# Patient Record
Sex: Female | Born: 1960 | Race: Black or African American | Hispanic: No | Smoking: Current every day smoker
Health system: Southern US, Community
[De-identification: ages and names within clinical notes are randomized; demographics above are authoritative.]

## PROBLEM LIST (undated history)

## (undated) DIAGNOSIS — F410 Panic disorder [episodic paroxysmal anxiety] without agoraphobia: Secondary | ICD-10-CM

## (undated) HISTORY — PX: FOOT SURGERY: SHX648

## (undated) HISTORY — PX: TUBAL LIGATION: SHX77

---

## 1998-03-27 ENCOUNTER — Encounter: Admission: RE | Admit: 1998-03-27 | Discharge: 1998-03-27 | Payer: Self-pay | Admitting: Internal Medicine

## 1998-04-04 ENCOUNTER — Encounter: Admission: RE | Admit: 1998-04-04 | Discharge: 1998-07-03 | Payer: Self-pay | Admitting: *Deleted

## 1998-05-22 ENCOUNTER — Emergency Department (HOSPITAL_COMMUNITY): Admission: EM | Admit: 1998-05-22 | Discharge: 1998-05-22 | Payer: Self-pay | Admitting: Emergency Medicine

## 1998-05-31 ENCOUNTER — Emergency Department (HOSPITAL_COMMUNITY): Admission: EM | Admit: 1998-05-31 | Discharge: 1998-05-31 | Payer: Self-pay | Admitting: Emergency Medicine

## 1998-07-05 ENCOUNTER — Encounter: Admission: RE | Admit: 1998-07-05 | Discharge: 1998-07-05 | Payer: Self-pay | Admitting: Internal Medicine

## 1998-08-04 ENCOUNTER — Emergency Department (HOSPITAL_COMMUNITY): Admission: EM | Admit: 1998-08-04 | Discharge: 1998-08-04 | Payer: Self-pay

## 1998-08-24 ENCOUNTER — Emergency Department (HOSPITAL_COMMUNITY): Admission: EM | Admit: 1998-08-24 | Discharge: 1998-08-24 | Payer: Self-pay | Admitting: Emergency Medicine

## 1998-09-11 ENCOUNTER — Encounter: Admission: RE | Admit: 1998-09-11 | Discharge: 1998-09-11 | Payer: Self-pay | Admitting: Internal Medicine

## 1998-09-11 ENCOUNTER — Ambulatory Visit (HOSPITAL_COMMUNITY): Admission: RE | Admit: 1998-09-11 | Discharge: 1998-09-11 | Payer: Self-pay | Admitting: Internal Medicine

## 1998-10-28 ENCOUNTER — Emergency Department (HOSPITAL_COMMUNITY): Admission: EM | Admit: 1998-10-28 | Discharge: 1998-10-28 | Payer: Self-pay | Admitting: Emergency Medicine

## 1998-10-28 ENCOUNTER — Encounter: Payer: Self-pay | Admitting: Emergency Medicine

## 1998-11-24 ENCOUNTER — Emergency Department (HOSPITAL_COMMUNITY): Admission: EM | Admit: 1998-11-24 | Discharge: 1998-11-24 | Payer: Self-pay | Admitting: Emergency Medicine

## 1999-01-20 ENCOUNTER — Emergency Department (HOSPITAL_COMMUNITY): Admission: EM | Admit: 1999-01-20 | Discharge: 1999-01-20 | Payer: Self-pay | Admitting: Emergency Medicine

## 1999-02-05 ENCOUNTER — Encounter: Admission: RE | Admit: 1999-02-05 | Discharge: 1999-02-05 | Payer: Self-pay | Admitting: Internal Medicine

## 1999-03-17 ENCOUNTER — Emergency Department (HOSPITAL_COMMUNITY): Admission: EM | Admit: 1999-03-17 | Discharge: 1999-03-17 | Payer: Self-pay | Admitting: Emergency Medicine

## 1999-10-05 ENCOUNTER — Emergency Department (HOSPITAL_COMMUNITY): Admission: EM | Admit: 1999-10-05 | Discharge: 1999-10-05 | Payer: Self-pay | Admitting: Internal Medicine

## 1999-10-31 ENCOUNTER — Emergency Department (HOSPITAL_COMMUNITY): Admission: EM | Admit: 1999-10-31 | Discharge: 1999-10-31 | Payer: Self-pay | Admitting: Emergency Medicine

## 2000-04-09 ENCOUNTER — Emergency Department (HOSPITAL_COMMUNITY): Admission: EM | Admit: 2000-04-09 | Discharge: 2000-04-09 | Payer: Self-pay

## 2000-09-05 ENCOUNTER — Emergency Department (HOSPITAL_COMMUNITY): Admission: EM | Admit: 2000-09-05 | Discharge: 2000-09-05 | Payer: Self-pay | Admitting: Emergency Medicine

## 2000-12-04 ENCOUNTER — Emergency Department (HOSPITAL_COMMUNITY): Admission: EM | Admit: 2000-12-04 | Discharge: 2000-12-04 | Payer: Self-pay | Admitting: Emergency Medicine

## 2001-03-27 ENCOUNTER — Emergency Department (HOSPITAL_COMMUNITY): Admission: EM | Admit: 2001-03-27 | Discharge: 2001-03-27 | Payer: Self-pay | Admitting: Emergency Medicine

## 2001-06-08 ENCOUNTER — Emergency Department (HOSPITAL_COMMUNITY): Admission: EM | Admit: 2001-06-08 | Discharge: 2001-06-08 | Payer: Self-pay | Admitting: Emergency Medicine

## 2001-07-22 ENCOUNTER — Emergency Department (HOSPITAL_COMMUNITY): Admission: EM | Admit: 2001-07-22 | Discharge: 2001-07-22 | Payer: Self-pay | Admitting: Emergency Medicine

## 2001-07-28 ENCOUNTER — Encounter: Admission: RE | Admit: 2001-07-28 | Discharge: 2001-07-28 | Payer: Self-pay | Admitting: Internal Medicine

## 2001-08-04 ENCOUNTER — Encounter: Admission: RE | Admit: 2001-08-04 | Discharge: 2001-08-04 | Payer: Self-pay | Admitting: Internal Medicine

## 2001-11-15 ENCOUNTER — Emergency Department (HOSPITAL_COMMUNITY): Admission: EM | Admit: 2001-11-15 | Discharge: 2001-11-15 | Payer: Self-pay | Admitting: Emergency Medicine

## 2001-11-18 ENCOUNTER — Inpatient Hospital Stay (HOSPITAL_COMMUNITY): Admission: AD | Admit: 2001-11-18 | Discharge: 2001-11-18 | Payer: Self-pay | Admitting: *Deleted

## 2001-11-18 ENCOUNTER — Encounter: Payer: Self-pay | Admitting: Obstetrics & Gynecology

## 2001-11-18 ENCOUNTER — Encounter: Admission: RE | Admit: 2001-11-18 | Discharge: 2001-11-18 | Payer: Self-pay | Admitting: Internal Medicine

## 2001-11-21 ENCOUNTER — Inpatient Hospital Stay (HOSPITAL_COMMUNITY): Admission: AD | Admit: 2001-11-21 | Discharge: 2001-11-21 | Payer: Self-pay | Admitting: Obstetrics

## 2001-11-24 ENCOUNTER — Inpatient Hospital Stay (HOSPITAL_COMMUNITY): Admission: AD | Admit: 2001-11-24 | Discharge: 2001-11-24 | Payer: Self-pay | Admitting: Obstetrics & Gynecology

## 2001-12-02 ENCOUNTER — Encounter: Admission: RE | Admit: 2001-12-02 | Discharge: 2001-12-02 | Payer: Self-pay | Admitting: Obstetrics & Gynecology

## 2002-01-05 ENCOUNTER — Emergency Department (HOSPITAL_COMMUNITY): Admission: EM | Admit: 2002-01-05 | Discharge: 2002-01-05 | Payer: Self-pay | Admitting: Emergency Medicine

## 2002-01-09 ENCOUNTER — Encounter: Admission: RE | Admit: 2002-01-09 | Discharge: 2002-01-09 | Payer: Self-pay | Admitting: Internal Medicine

## 2002-03-27 ENCOUNTER — Encounter: Admission: RE | Admit: 2002-03-27 | Discharge: 2002-03-27 | Payer: Self-pay

## 2002-04-24 ENCOUNTER — Encounter: Admission: RE | Admit: 2002-04-24 | Discharge: 2002-04-24 | Payer: Self-pay | Admitting: Internal Medicine

## 2002-05-25 ENCOUNTER — Encounter: Admission: RE | Admit: 2002-05-25 | Discharge: 2002-05-25 | Payer: Self-pay | Admitting: Internal Medicine

## 2002-07-10 ENCOUNTER — Encounter: Admission: RE | Admit: 2002-07-10 | Discharge: 2002-07-10 | Payer: Self-pay | Admitting: Internal Medicine

## 2002-07-24 ENCOUNTER — Emergency Department (HOSPITAL_COMMUNITY): Admission: EM | Admit: 2002-07-24 | Discharge: 2002-07-25 | Payer: Self-pay | Admitting: Emergency Medicine

## 2002-08-10 ENCOUNTER — Encounter: Admission: RE | Admit: 2002-08-10 | Discharge: 2002-08-10 | Payer: Self-pay | Admitting: Internal Medicine

## 2002-08-18 ENCOUNTER — Ambulatory Visit (HOSPITAL_BASED_OUTPATIENT_CLINIC_OR_DEPARTMENT_OTHER): Admission: RE | Admit: 2002-08-18 | Discharge: 2002-08-18 | Payer: Self-pay | Admitting: Podiatry

## 2002-08-24 ENCOUNTER — Encounter: Admission: RE | Admit: 2002-08-24 | Discharge: 2002-08-24 | Payer: Self-pay | Admitting: *Deleted

## 2003-02-23 ENCOUNTER — Emergency Department (HOSPITAL_COMMUNITY): Admission: EM | Admit: 2003-02-23 | Discharge: 2003-02-23 | Payer: Self-pay | Admitting: *Deleted

## 2003-03-09 ENCOUNTER — Encounter: Payer: Self-pay | Admitting: Emergency Medicine

## 2003-03-09 ENCOUNTER — Emergency Department (HOSPITAL_COMMUNITY): Admission: EM | Admit: 2003-03-09 | Discharge: 2003-03-09 | Payer: Self-pay | Admitting: Emergency Medicine

## 2003-03-10 ENCOUNTER — Emergency Department (HOSPITAL_COMMUNITY): Admission: EM | Admit: 2003-03-10 | Discharge: 2003-03-10 | Payer: Self-pay | Admitting: Emergency Medicine

## 2003-06-23 ENCOUNTER — Emergency Department (HOSPITAL_COMMUNITY): Admission: EM | Admit: 2003-06-23 | Discharge: 2003-06-23 | Payer: Self-pay | Admitting: Emergency Medicine

## 2003-07-13 ENCOUNTER — Encounter: Admission: RE | Admit: 2003-07-13 | Discharge: 2003-07-13 | Payer: Self-pay | Admitting: Internal Medicine

## 2003-08-29 ENCOUNTER — Encounter: Admission: RE | Admit: 2003-08-29 | Discharge: 2003-08-29 | Payer: Self-pay | Admitting: Internal Medicine

## 2003-10-02 ENCOUNTER — Emergency Department (HOSPITAL_COMMUNITY): Admission: EM | Admit: 2003-10-02 | Discharge: 2003-10-02 | Payer: Self-pay

## 2004-04-11 ENCOUNTER — Inpatient Hospital Stay (HOSPITAL_COMMUNITY): Admission: AD | Admit: 2004-04-11 | Discharge: 2004-04-11 | Payer: Self-pay | Admitting: *Deleted

## 2004-09-28 ENCOUNTER — Emergency Department (HOSPITAL_COMMUNITY): Admission: EM | Admit: 2004-09-28 | Discharge: 2004-09-28 | Payer: Self-pay | Admitting: Emergency Medicine

## 2004-12-29 ENCOUNTER — Emergency Department (HOSPITAL_COMMUNITY): Admission: EM | Admit: 2004-12-29 | Discharge: 2004-12-29 | Payer: Self-pay | Admitting: Emergency Medicine

## 2005-03-10 ENCOUNTER — Emergency Department (HOSPITAL_COMMUNITY): Admission: EM | Admit: 2005-03-10 | Discharge: 2005-03-10 | Payer: Self-pay | Admitting: Emergency Medicine

## 2005-04-24 ENCOUNTER — Emergency Department (HOSPITAL_COMMUNITY): Admission: EM | Admit: 2005-04-24 | Discharge: 2005-04-25 | Payer: Self-pay | Admitting: Emergency Medicine

## 2005-08-05 ENCOUNTER — Emergency Department (HOSPITAL_COMMUNITY): Admission: EM | Admit: 2005-08-05 | Discharge: 2005-08-05 | Payer: Self-pay | Admitting: Emergency Medicine

## 2006-03-01 ENCOUNTER — Ambulatory Visit: Payer: Self-pay | Admitting: Hospitalist

## 2006-03-01 ENCOUNTER — Ambulatory Visit (HOSPITAL_COMMUNITY): Admission: RE | Admit: 2006-03-01 | Discharge: 2006-03-01 | Payer: Self-pay | Admitting: Hospitalist

## 2006-03-08 ENCOUNTER — Ambulatory Visit: Payer: Self-pay | Admitting: Internal Medicine

## 2006-03-22 ENCOUNTER — Ambulatory Visit: Payer: Self-pay | Admitting: Internal Medicine

## 2006-07-29 ENCOUNTER — Emergency Department (HOSPITAL_COMMUNITY): Admission: EM | Admit: 2006-07-29 | Discharge: 2006-07-30 | Payer: Self-pay | Admitting: Emergency Medicine

## 2006-09-03 ENCOUNTER — Emergency Department (HOSPITAL_COMMUNITY): Admission: EM | Admit: 2006-09-03 | Discharge: 2006-09-04 | Payer: Self-pay | Admitting: Emergency Medicine

## 2006-10-14 DIAGNOSIS — R300 Dysuria: Secondary | ICD-10-CM | POA: Insufficient documentation

## 2006-10-14 DIAGNOSIS — E119 Type 2 diabetes mellitus without complications: Secondary | ICD-10-CM | POA: Insufficient documentation

## 2006-10-14 DIAGNOSIS — R809 Proteinuria, unspecified: Secondary | ICD-10-CM | POA: Insufficient documentation

## 2006-10-14 DIAGNOSIS — R42 Dizziness and giddiness: Secondary | ICD-10-CM | POA: Insufficient documentation

## 2006-11-12 ENCOUNTER — Emergency Department (HOSPITAL_COMMUNITY): Admission: EM | Admit: 2006-11-12 | Discharge: 2006-11-12 | Payer: Self-pay | Admitting: Emergency Medicine

## 2006-12-09 DIAGNOSIS — D649 Anemia, unspecified: Secondary | ICD-10-CM

## 2006-12-09 DIAGNOSIS — J40 Bronchitis, not specified as acute or chronic: Secondary | ICD-10-CM | POA: Insufficient documentation

## 2006-12-09 DIAGNOSIS — J301 Allergic rhinitis due to pollen: Secondary | ICD-10-CM | POA: Insufficient documentation

## 2006-12-09 DIAGNOSIS — O009 Unspecified ectopic pregnancy without intrauterine pregnancy: Secondary | ICD-10-CM | POA: Insufficient documentation

## 2006-12-09 DIAGNOSIS — Q742 Other congenital malformations of lower limb(s), including pelvic girdle: Secondary | ICD-10-CM

## 2006-12-09 DIAGNOSIS — O039 Complete or unspecified spontaneous abortion without complication: Secondary | ICD-10-CM | POA: Insufficient documentation

## 2006-12-15 ENCOUNTER — Emergency Department (HOSPITAL_COMMUNITY): Admission: EM | Admit: 2006-12-15 | Discharge: 2006-12-16 | Payer: Self-pay | Admitting: Emergency Medicine

## 2007-01-29 ENCOUNTER — Emergency Department (HOSPITAL_COMMUNITY): Admission: EM | Admit: 2007-01-29 | Discharge: 2007-01-29 | Payer: Self-pay | Admitting: Emergency Medicine

## 2007-04-15 ENCOUNTER — Inpatient Hospital Stay (HOSPITAL_COMMUNITY): Admission: AD | Admit: 2007-04-15 | Discharge: 2007-04-15 | Payer: Self-pay | Admitting: Family Medicine

## 2007-05-13 ENCOUNTER — Emergency Department (HOSPITAL_COMMUNITY): Admission: EM | Admit: 2007-05-13 | Discharge: 2007-05-13 | Payer: Self-pay | Admitting: Emergency Medicine

## 2007-05-18 ENCOUNTER — Encounter (INDEPENDENT_AMBULATORY_CARE_PROVIDER_SITE_OTHER): Payer: Self-pay | Admitting: *Deleted

## 2007-05-18 ENCOUNTER — Ambulatory Visit: Payer: Self-pay | Admitting: Internal Medicine

## 2007-05-18 DIAGNOSIS — R1110 Vomiting, unspecified: Secondary | ICD-10-CM

## 2007-05-18 LAB — CONVERTED CEMR LAB
BUN: 8 mg/dL (ref 6–23)
Blood Glucose, Fingerstick: 210
Hgb A1c MFr Bld: 10.5 %
Potassium: 4.1 meq/L (ref 3.5–5.3)
Sodium: 140 meq/L (ref 135–145)

## 2007-05-19 ENCOUNTER — Telehealth (INDEPENDENT_AMBULATORY_CARE_PROVIDER_SITE_OTHER): Payer: Self-pay | Admitting: *Deleted

## 2007-08-07 ENCOUNTER — Emergency Department (HOSPITAL_COMMUNITY): Admission: EM | Admit: 2007-08-07 | Discharge: 2007-08-07 | Payer: Self-pay | Admitting: Emergency Medicine

## 2007-09-03 ENCOUNTER — Emergency Department (HOSPITAL_COMMUNITY): Admission: EM | Admit: 2007-09-03 | Discharge: 2007-09-03 | Payer: Self-pay | Admitting: Emergency Medicine

## 2007-09-14 ENCOUNTER — Encounter (INDEPENDENT_AMBULATORY_CARE_PROVIDER_SITE_OTHER): Payer: Self-pay | Admitting: *Deleted

## 2007-09-14 ENCOUNTER — Ambulatory Visit: Payer: Self-pay | Admitting: Internal Medicine

## 2007-09-14 DIAGNOSIS — M26629 Arthralgia of temporomandibular joint, unspecified side: Secondary | ICD-10-CM

## 2007-09-14 DIAGNOSIS — F17200 Nicotine dependence, unspecified, uncomplicated: Secondary | ICD-10-CM

## 2007-09-14 LAB — CONVERTED CEMR LAB
Blood Glucose, Fingerstick: 195
Hgb A1c MFr Bld: 8.9 %

## 2007-11-19 ENCOUNTER — Emergency Department (HOSPITAL_COMMUNITY): Admission: EM | Admit: 2007-11-19 | Discharge: 2007-11-19 | Payer: Self-pay | Admitting: Emergency Medicine

## 2008-03-07 ENCOUNTER — Emergency Department (HOSPITAL_COMMUNITY): Admission: EM | Admit: 2008-03-07 | Discharge: 2008-03-08 | Payer: Self-pay | Admitting: Emergency Medicine

## 2008-07-06 ENCOUNTER — Emergency Department (HOSPITAL_COMMUNITY): Admission: EM | Admit: 2008-07-06 | Discharge: 2008-07-06 | Payer: Self-pay | Admitting: Emergency Medicine

## 2008-11-06 ENCOUNTER — Emergency Department (HOSPITAL_COMMUNITY): Admission: EM | Admit: 2008-11-06 | Discharge: 2008-11-06 | Payer: Self-pay | Admitting: Emergency Medicine

## 2009-01-06 ENCOUNTER — Emergency Department (HOSPITAL_COMMUNITY): Admission: EM | Admit: 2009-01-06 | Discharge: 2009-01-07 | Payer: Self-pay | Admitting: Emergency Medicine

## 2009-02-12 ENCOUNTER — Emergency Department (HOSPITAL_COMMUNITY): Admission: EM | Admit: 2009-02-12 | Discharge: 2009-02-12 | Payer: Self-pay | Admitting: Emergency Medicine

## 2009-02-13 ENCOUNTER — Emergency Department (HOSPITAL_COMMUNITY): Admission: EM | Admit: 2009-02-13 | Discharge: 2009-02-13 | Payer: Self-pay | Admitting: Pediatrics

## 2009-06-14 ENCOUNTER — Emergency Department (HOSPITAL_COMMUNITY): Admission: EM | Admit: 2009-06-14 | Discharge: 2009-06-14 | Payer: Self-pay | Admitting: Emergency Medicine

## 2009-08-15 ENCOUNTER — Inpatient Hospital Stay (HOSPITAL_COMMUNITY): Admission: AD | Admit: 2009-08-15 | Discharge: 2009-08-16 | Payer: Self-pay | Admitting: Obstetrics and Gynecology

## 2009-08-21 ENCOUNTER — Emergency Department (HOSPITAL_COMMUNITY): Admission: EM | Admit: 2009-08-21 | Discharge: 2009-08-21 | Payer: Self-pay | Admitting: Emergency Medicine

## 2009-09-29 ENCOUNTER — Emergency Department (HOSPITAL_COMMUNITY): Admission: EM | Admit: 2009-09-29 | Discharge: 2009-09-29 | Payer: Self-pay | Admitting: Emergency Medicine

## 2010-09-25 ENCOUNTER — Emergency Department (HOSPITAL_COMMUNITY)
Admission: EM | Admit: 2010-09-25 | Discharge: 2010-09-25 | Payer: Self-pay | Source: Home / Self Care | Admitting: Emergency Medicine

## 2010-11-17 ENCOUNTER — Inpatient Hospital Stay (HOSPITAL_COMMUNITY)
Admission: AD | Admit: 2010-11-17 | Discharge: 2010-11-17 | Payer: Self-pay | Source: Home / Self Care | Attending: Obstetrics & Gynecology | Admitting: Obstetrics & Gynecology

## 2010-12-18 ENCOUNTER — Ambulatory Visit: Admit: 2010-12-18 | Payer: Self-pay | Admitting: Obstetrics and Gynecology

## 2011-02-09 LAB — URINE MICROSCOPIC-ADD ON

## 2011-02-09 LAB — URINALYSIS, ROUTINE W REFLEX MICROSCOPIC
Bilirubin Urine: NEGATIVE
Leukocytes, UA: NEGATIVE
Nitrite: NEGATIVE
Specific Gravity, Urine: 1.02 (ref 1.005–1.030)
Urobilinogen, UA: 0.2 mg/dL (ref 0.0–1.0)

## 2011-02-09 LAB — WET PREP, GENITAL: Trich, Wet Prep: NONE SEEN

## 2011-02-09 LAB — GC/CHLAMYDIA PROBE AMP, GENITAL
Chlamydia, DNA Probe: NEGATIVE
GC Probe Amp, Genital: NEGATIVE

## 2011-02-11 LAB — GLUCOSE, CAPILLARY

## 2011-03-05 LAB — URINALYSIS, ROUTINE W REFLEX MICROSCOPIC
Glucose, UA: 100 mg/dL — AB
Protein, ur: 100 mg/dL — AB
Specific Gravity, Urine: 1.016 (ref 1.005–1.030)

## 2011-03-05 LAB — BASIC METABOLIC PANEL
BUN: 13 mg/dL (ref 6–23)
Calcium: 9 mg/dL (ref 8.4–10.5)
GFR calc non Af Amer: >60 mL/min (ref >60)
Glucose, Bld: 235 mg/dL — ABNORMAL HIGH (ref 70–99)

## 2011-03-05 LAB — CBC
Platelets: 291 10*3/uL (ref 150–400)
RDW: 15.1 % (ref 11.5–15.5)

## 2011-03-05 LAB — URINE MICROSCOPIC-ADD ON

## 2011-03-05 LAB — DIFFERENTIAL
Basophils Absolute: 0.1 10*3/uL (ref 0.0–0.1)
Eosinophils Relative: 4 % (ref 0–5)
Lymphocytes Relative: 28 % (ref 12–46)
Neutro Abs: 6.6 10*3/uL (ref 1.7–7.7)

## 2011-03-05 LAB — POCT PREGNANCY, URINE: Preg Test, Ur: NEGATIVE

## 2011-03-06 LAB — GLUCOSE, CAPILLARY

## 2011-03-06 LAB — POCT PREGNANCY, URINE: Preg Test, Ur: NEGATIVE

## 2011-03-06 LAB — WET PREP, GENITAL: Trich, Wet Prep: NONE SEEN

## 2011-03-08 LAB — GLUCOSE, CAPILLARY: Glucose-Capillary: 127 mg/dL — ABNORMAL HIGH (ref 70–99)

## 2011-03-12 LAB — BASIC METABOLIC PANEL
BUN: 12 mg/dL (ref 6–23)
CO2: 26 mEq/L (ref 19–32)
Calcium: 9.3 mg/dL (ref 8.4–10.5)
Creatinine, Ser: 0.81 mg/dL (ref 0.4–1.2)
GFR calc Af Amer: >60 mL/min (ref >60)

## 2011-03-12 LAB — DIFFERENTIAL
Basophils Absolute: 0 10*3/uL (ref 0.0–0.1)
Lymphocytes Relative: 31 % (ref 12–46)
Lymphs Abs: 3.2 10*3/uL (ref 0.7–4.0)
Monocytes Absolute: 0.5 10*3/uL (ref 0.1–1.0)
Neutro Abs: 6.3 10*3/uL (ref 1.7–7.7)

## 2011-03-12 LAB — CK TOTAL AND CKMB (NOT AT ARMC): Total CK: 79 U/L (ref 7–177)

## 2011-03-12 LAB — URINALYSIS, ROUTINE W REFLEX MICROSCOPIC
Leukocytes, UA: NEGATIVE
Protein, ur: 100 mg/dL — AB
Specific Gravity, Urine: 1.021 (ref 1.005–1.030)
Urobilinogen, UA: 0.2 mg/dL (ref 0.0–1.0)

## 2011-03-12 LAB — CBC
HCT: 41.8 % (ref 36.0–46.0)
Hemoglobin: 13.3 g/dL (ref 12.0–15.0)
MCHC: 31.7 g/dL (ref 30.0–36.0)
RBC: 5.88 MIL/uL — ABNORMAL HIGH (ref 3.87–5.11)
RDW: 15.2 % (ref 11.5–15.5)

## 2011-03-12 LAB — URINE MICROSCOPIC-ADD ON

## 2011-03-12 LAB — GLUCOSE, CAPILLARY

## 2011-03-12 LAB — TROPONIN I: Troponin I: 0.04 ng/mL (ref 0.00–0.06)

## 2011-03-17 LAB — POCT I-STAT, CHEM 8
BUN: 9 mg/dL (ref 6–23)
Calcium, Ion: 1.15 mmol/L (ref 1.12–1.32)
Creatinine, Ser: 0.8 mg/dL (ref 0.4–1.2)
Glucose, Bld: 163 mg/dL — ABNORMAL HIGH (ref 70–99)
Sodium: 138 mEq/L (ref 135–145)
TCO2: 27 mmol/L (ref 0–100)

## 2011-03-17 LAB — URINALYSIS, ROUTINE W REFLEX MICROSCOPIC
Leukocytes, UA: NEGATIVE
Nitrite: POSITIVE — AB
Specific Gravity, Urine: 1.01 (ref 1.005–1.030)
Urobilinogen, UA: 1 mg/dL (ref 0.0–1.0)

## 2011-03-17 LAB — URINE MICROSCOPIC-ADD ON

## 2011-04-17 NOTE — Op Note (Signed)
NAME:  Carol Hardy, Carol Hardy                          ACCOUNT NO.:  [id number]   MEDICAL RECORD NO.:  [id number]                   PATIENT TYPE:  AMB   LOCATION:  DSC                                  FACILITY:  [facility]   PHYSICIAN:  Ysidro Evert. Regal, D.P.M.              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   POSTOPERATIVE DIAGNOSIS:  Brachymetatarsia of the left third and fourth  metatarsal of the left foot.   OPERATION:  1. Osteotomy of the third metatarsal left.  2. Osteotomy of the fourth metatarsal left.  3. Application of an external fixator to allow for callus distraction and     elongation of the third and fourth metatarsal bones.   SURGEON:  Ysidro Evert. Regal, D.P.M.   ASSISTANT:  Angeles Secord   ANESTHESIA:  IV sedation with local infiltration.   HEMOSTASIS:  Ankle tourniquet left.   INDICATIONS FOR PROCEDURE:  Discomfort with more and more difficulty with  weight bearing secondary to positional components of short, congential third  and fourth metatarsal bones confirmed on x-ray.   FINDINGS AND PROCEDURE:  This patient was brought to the operating room,  placed in the supine position on the operating room table.  The patient was  injected with a total of 15 cc of Xylocaine and Marcaine mixture.  The  patient's left foot was prepped and draped utilizing standard aseptic  technique.  The left was exsanguinated utilizing Esmarch and the left ankle  tourniquet was inflated to 250 mmHg.  The following procedures were  performed.   PROCEDURE #1:  Osteotomy of the fourth metatarsal left.  Attention was  directed to the dorsal aspect of the fourth metatarsal left, where a 4 cm  incision was made over the shaft of the fourth metatarsal.  The incision was  deepened down to  subcutaneous tissue.  We utilized internal Zy Scan. We  visualized the base of the fourth metatarsal.  We then excised capsular  tissue to allow for good exposure of the metatarsal shaft itself.  The  osteotomy was cut after application of the external fixator but as a part of  a conjoined type procedure.  Closure will follow.   PROCEDURE #2:  Osteotomy third metatarsal left.  Attention was directed to  the dorsal aspect of the third metatarsal left, where an approximate 3 cm  linear incision was made over the shaft of the third metatarsal.  The  incision was deepened down to subcutaneous tissue and down to capsule.  Where the capsular tissue was sharply dissected off the underlying bone.  Utilized internal Zy Scan we visualized the base of the third metatarsal and  found the place that osteotomy cut would be made.  The cut was planned and  the foot was prepped in order for this cut to be made.  We then applied the  external fixator, the first more most proximal pin was placed in the cuboid  and visualized with internal Zy Scan.  We then placed the external fixator  device with three different rings to it.  We placed the pin in the fourth  metatarsal neck and visualized with Zy Scan.  A pin was then placed in the  third metatarsal neck, visualized with Zy Scan.  A second pin was placed in  the distal fourth metatarsal, a second pin was placed in the third  metatarsal.  We then placed a proximal pin into the cuboid and we then did  osteotomy cuts in both the third and fourth metatarsal bones.  This was  performed just proximal to the base of the third and fourth metatarsals.  At  this point, we activated the fixation device.  We were able to distract the  third and fourth metatarsals adequately.  We then recompressed and bolted  down the device.  At this point, we added an L ring to the medial side in  order to provide for stabilization and prevent Tor on the third metatarsal.  This was  found to be in adequate position and all fixation was found to be  adequate as visualized on internal Zy Scan.  We did copious flush to the  area.  The subcutaneous tissues was reapproximated utilizing 4-0 Dexon in  continuous running fashion.  Skin margins were reapproximated utilizing 5-0  Dexon in a running baseball stitch.  Wound edges, we utilized 1 cc of  dexamethasone.  Dry sterile compressive dressing was applied to the left  foot.  The left ankle tourniquet was deflated.  Capillary fill was noted to  be immediate to all joints of the left foot.  The patient having tolerated  the surgery and anesthesia well was taken from the operating room in  satisfactory condition.                                               Ysidro Evert. Regal, D.P.M.    NSR/MEDQ  D:  08/18/2002  T:  08/20/2002  Job:  [id number]

## 2011-05-11 ENCOUNTER — Emergency Department (HOSPITAL_COMMUNITY)
Admission: EM | Admit: 2011-05-11 | Discharge: 2011-05-11 | Disposition: A | Discharge status: Home / Self Care | Payer: Self-pay | Attending: Emergency Medicine | Admitting: Emergency Medicine

## 2011-05-11 ENCOUNTER — Emergency Department (HOSPITAL_COMMUNITY): Payer: Self-pay

## 2011-05-11 DIAGNOSIS — T148XXA Other injury of unspecified body region, initial encounter: Secondary | ICD-10-CM | POA: Insufficient documentation

## 2011-05-11 DIAGNOSIS — I10 Essential (primary) hypertension: Secondary | ICD-10-CM | POA: Insufficient documentation

## 2011-05-11 DIAGNOSIS — F17200 Nicotine dependence, unspecified, uncomplicated: Secondary | ICD-10-CM | POA: Insufficient documentation

## 2011-05-11 DIAGNOSIS — R079 Chest pain, unspecified: Secondary | ICD-10-CM | POA: Insufficient documentation

## 2011-05-11 LAB — URINALYSIS, ROUTINE W REFLEX MICROSCOPIC
Bilirubin Urine: NEGATIVE
Nitrite: NEGATIVE
Specific Gravity, Urine: 1.021 (ref 1.005–1.030)
pH: 5 (ref 5.0–8.0)

## 2011-05-11 LAB — URINE MICROSCOPIC-ADD ON

## 2011-05-11 LAB — GLUCOSE, CAPILLARY: Glucose-Capillary: 145 mg/dL — ABNORMAL HIGH (ref 70–99)

## 2011-08-25 LAB — GC/CHLAMYDIA PROBE AMP, GENITAL
Chlamydia, DNA Probe: NEGATIVE
GC Probe Amp, Genital: NEGATIVE

## 2011-08-25 LAB — PREGNANCY, URINE: Preg Test, Ur: NEGATIVE

## 2011-08-25 LAB — URINALYSIS, ROUTINE W REFLEX MICROSCOPIC
Bilirubin Urine: NEGATIVE
Ketones, ur: NEGATIVE
Nitrite: NEGATIVE
Specific Gravity, Urine: 1.012
Urobilinogen, UA: 0.2

## 2011-08-25 LAB — WET PREP, GENITAL

## 2011-09-03 LAB — POCT I-STAT, CHEM 8
BUN: 17 mg/dL (ref 6–23)
Creatinine, Ser: 0.9 mg/dL (ref 0.4–1.2)
Glucose, Bld: 209 mg/dL — ABNORMAL HIGH (ref 70–99)
Sodium: 140 mEq/L (ref 135–145)
TCO2: 26 mmol/L (ref 0–100)

## 2011-09-03 LAB — GLUCOSE, CAPILLARY: Glucose-Capillary: 237 mg/dL — ABNORMAL HIGH (ref 70–99)

## 2011-09-17 LAB — DIFFERENTIAL
Basophils Relative: 1
Eosinophils Absolute: 0.5
Lymphs Abs: 3.8 — ABNORMAL HIGH
Monocytes Absolute: 0.5
Neutro Abs: 5.4

## 2011-09-17 LAB — URINALYSIS, ROUTINE W REFLEX MICROSCOPIC
Glucose, UA: >1000 — AB
Hgb urine dipstick: NEGATIVE
Ketones, ur: NEGATIVE
Leukocytes, UA: NEGATIVE
pH: 6

## 2011-09-17 LAB — CBC
Hemoglobin: 13.1
MCHC: 31.4
MCV: 69.8 — ABNORMAL LOW
RBC: 5.96 — ABNORMAL HIGH

## 2011-09-17 LAB — URINE MICROSCOPIC-ADD ON

## 2011-09-17 LAB — WET PREP, GENITAL
Trich, Wet Prep: NONE SEEN
Yeast Wet Prep HPF POC: NONE SEEN

## 2011-10-27 ENCOUNTER — Emergency Department (HOSPITAL_COMMUNITY)
Admission: EM | Admit: 2011-10-27 | Discharge: 2011-10-27 | Disposition: A | Discharge status: Home / Self Care | Payer: Self-pay | Attending: Emergency Medicine | Admitting: Emergency Medicine

## 2011-10-27 ENCOUNTER — Emergency Department (HOSPITAL_COMMUNITY): Payer: Self-pay

## 2011-10-27 ENCOUNTER — Encounter: Payer: Self-pay | Admitting: Emergency Medicine

## 2011-10-27 DIAGNOSIS — M25511 Pain in right shoulder: Secondary | ICD-10-CM

## 2011-10-27 DIAGNOSIS — F17200 Nicotine dependence, unspecified, uncomplicated: Secondary | ICD-10-CM | POA: Insufficient documentation

## 2011-10-27 DIAGNOSIS — G8929 Other chronic pain: Secondary | ICD-10-CM | POA: Insufficient documentation

## 2011-10-27 DIAGNOSIS — M25519 Pain in unspecified shoulder: Secondary | ICD-10-CM | POA: Insufficient documentation

## 2011-10-27 MED ORDER — OXYCODONE-ACETAMINOPHEN 5-325 MG PO TABS
1.0000 | ORAL_TABLET | Freq: Once | ORAL | Status: AC
  Administered 2011-10-27: 1 via ORAL
  Filled 2011-10-27: qty 1

## 2011-10-27 MED ORDER — ONDANSETRON HCL 4 MG PO TABS: 4.0000 mg | ORAL_TABLET | Freq: Once | ORAL | Status: DC

## 2011-10-27 MED ORDER — OXYCODONE-ACETAMINOPHEN 5-325 MG PO TABS: 1.0000 | ORAL_TABLET | Freq: Four times a day (QID) | ORAL | Status: AC | PRN

## 2011-10-27 NOTE — ED Notes (Signed)
Secondary Assessment: pt c/o right arm pain, 10/10, sharp, continuous, no radiation, no nausea, vomiting, no chest pain, no sob, onset since this summer but is worsen yesterday. Limited ROM noted. Pain worsen with movement. Pt alert, oriented.

## 2011-10-27 NOTE — ED Notes (Signed)
Pt states she thinks she had a heat stroke this summer, never was able to be seen. Pt states from that time has been unable to lift R arm above shoulder height. Pt states yesterday she got a pain in R shoulder followed by numbness. . No neuro deficits noted at this time. Pt grips equal bilateral.

## 2011-10-27 NOTE — ED Provider Notes (Signed)
History     CSN: [id number] Arrival date & time: 10/27/2011 12:41 PM   First MD Initiated Contact with Patient 10/27/11 1309      Chief Complaint  Patient presents with  . Shoulder Pain    (Consider location/radiation/quality/duration/timing/severity/associated sxs/prior treatment) HPI Pt reports R shoulder pain constantly for the last several months since 'heat stroke' during the summer. She was not seen at that time. Denies any known injury. States pain is worse with movement and unable to raise her arm above her shoulder. She had some tingling in her hand and increased pain earlier today and decided to come to the ED for evaluation. She was previously taking NSAIDs but has not taken anything in several weeks.   Past Medical History  Diagnosis Date  . Diabetes mellitus     History reviewed. No pertinent past surgical history.  History reviewed. No pertinent family history.  History  Substance Use Topics  . Smoking status: Current Everyday Smoker -- 0.5 packs/day    Types: Cigarettes  . Smokeless tobacco: Not on file  . Alcohol Use: No    OB History    Grav Para Term Preterm Abortions TAB SAB Ect Mult Living                  Review of Systems Unable to assess due to mental status.   Allergies  Ibuprofen and Vicodin  Home Medications  No current outpatient prescriptions on file.  BP 152/75  Pulse 92  Temp(Src) 98.9 F (37.2 C) (Oral)  Resp 16  SpO2 100%  LMP 10/05/2011  Physical Exam  Nursing note and vitals reviewed. Constitutional: She is oriented to person, place, and time. She appears well-developed and well-nourished.  HENT:  Head: Normocephalic and atraumatic.  Eyes: EOM are normal. Pupils are equal, round, and reactive to light.  Neck: Normal range of motion. Neck supple.  Cardiovascular: Normal rate, normal heart sounds and intact distal pulses.   Pulmonary/Chest: Effort normal and breath sounds normal.  Abdominal: Bowel sounds are normal.  She exhibits no distension. There is no tenderness.  Musculoskeletal: She exhibits tenderness. She exhibits no edema.       Pain with ROM of R shoulder, tender to palpation diffusely, no deformity, neurovascularly intact  Neurological: She is alert and oriented to person, place, and time. She has normal strength. No cranial nerve deficit or sensory deficit. She exhibits normal muscle tone.  Skin: Skin is warm and dry. No rash noted.  Psychiatric: She has a normal mood and affect.    ED Course  Procedures (including critical care time)  Labs Reviewed - No data to display No results found.   No diagnosis found.    MDM  Discussed xray findings with the patient and with Dr. Azucena Kuba. No known prior surgery. Has had prior shoulder injury which may have resulted in demonstrated findings. Pt advised to followup with Ortho for long term management. She has normal neuro exam now, no concern for CVA. Suspect some nerve impingement as the source of the tingling she experienced earlier today.        Charles B. Bernette Mayers, MD 10/27/11 1428

## 2011-10-27 NOTE — ED Notes (Signed)
Pyxis is out of the zofran oral ODT. Pharmacy called and is tubing [place] the med.

## 2011-10-30 ENCOUNTER — Emergency Department (HOSPITAL_COMMUNITY)
Admission: EM | Admit: 2011-10-30 | Discharge: 2011-10-30 | Disposition: A | Discharge status: Home / Self Care | Payer: Self-pay | Attending: Emergency Medicine | Admitting: Emergency Medicine

## 2011-10-30 ENCOUNTER — Encounter (HOSPITAL_COMMUNITY): Payer: Self-pay | Admitting: *Deleted

## 2011-10-30 DIAGNOSIS — E119 Type 2 diabetes mellitus without complications: Secondary | ICD-10-CM | POA: Insufficient documentation

## 2011-10-30 DIAGNOSIS — F17200 Nicotine dependence, unspecified, uncomplicated: Secondary | ICD-10-CM | POA: Insufficient documentation

## 2011-10-30 DIAGNOSIS — M24419 Recurrent dislocation, unspecified shoulder: Secondary | ICD-10-CM | POA: Insufficient documentation

## 2011-10-30 DIAGNOSIS — M25519 Pain in unspecified shoulder: Secondary | ICD-10-CM | POA: Insufficient documentation

## 2011-10-30 NOTE — ED Notes (Signed)
Pt states "I'm here for a note for school for a medical d/c saying I can be out of school, I'm not functioning with this medicine"

## 2011-10-31 NOTE — ED Provider Notes (Signed)
History     CSN: [id number] Arrival date & time: 10/30/2011  3:13 PM   First MD Initiated Contact with Patient 10/30/11 1658      Chief Complaint  Patient presents with  . Shoulder Pain    (Consider location/radiation/quality/duration/timing/severity/associated sxs/prior treatment) Patient is a 50 y.o. female presenting with shoulder pain. The history is provided by the patient. No language interpreter was used.  Shoulder Pain This is a chronic problem. Pertinent negatives include no chills, fever, nausea, neck pain, numbness or weakness. The symptoms are aggravated by walking, twisting, sneezing, coughing, bending and exertion. She has tried immobilization and NSAIDs for the symptoms. The treatment provided mild relief.    Past Medical History  Diagnosis Date  . Diabetes mellitus     History reviewed. No pertinent past surgical history.  No family history on file.  History  Substance Use Topics  . Smoking status: Current Everyday Smoker -- 0.5 packs/day    Types: Cigarettes  . Smokeless tobacco: Not on file  . Alcohol Use: No    OB History    Grav Para Term Preterm Abortions TAB SAB Ect Mult Living                  Review of Systems  Constitutional: Negative for fever and chills.  HENT: Negative for neck pain.   Gastrointestinal: Negative for nausea.  Neurological: Negative for weakness and numbness.  All other systems reviewed and are negative.    Allergies  Ibuprofen and Vicodin  Home Medications   Current Outpatient Rx  Name Route Sig Dispense Refill  . OXYCODONE-ACETAMINOPHEN 5-325 MG PO TABS Oral Take 1-2 tablets by mouth every 6 (six) hours as needed for pain. 20 tablet 0    BP 160/92  Pulse 81  Temp(Src) 98.4 F (36.9 C) (Oral)  Resp 16  Wt 160 lb (72.576 kg)  SpO2 100%  LMP 10/05/2011  Physical Exam  Nursing note and vitals reviewed. Constitutional: She is oriented to person, place, and time. She appears well-developed and  well-nourished. No distress.  Eyes: Pupils are equal, round, and reactive to light.  Neck: Normal range of motion.  Cardiovascular: Normal rate.   Pulmonary/Chest: Effort normal and breath sounds normal. No respiratory distress.  Abdominal: Soft.  Musculoskeletal:       R shoulder pain with chronic dislocation.  Needs to follow thru with ortho consult  Neurological: She is alert and oriented to person, place, and time.  Skin: Skin is warm and dry. She is not diaphoretic.  Psychiatric: She has a normal mood and affect.    ED Course  Procedures (including critical care time)  Labs Reviewed - No data to display No results found.   1. 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   Has not followed up with ortho yet.  Will give her a note until she can get to ortho next week.  Encouraged to follow through.  She has percocet for pain and can not "function" at school on this.  Suggested Ibuprofen during the day and narcotics at night.           Jethro Bastos, NP 10/31/11 [phone]

## 2011-11-01 NOTE — ED Provider Notes (Signed)
Medical screening examination/treatment/procedure(s) were performed by non-physician practitioner and as supervising physician I was immediately available for consultation/collaboration.  Marcha Licklider, MD 11/01/11 1716 

## 2012-06-06 ENCOUNTER — Encounter (HOSPITAL_COMMUNITY): Payer: Self-pay | Admitting: *Deleted

## 2012-06-06 ENCOUNTER — Emergency Department (HOSPITAL_COMMUNITY)
Admission: EM | Admit: 2012-06-06 | Discharge: 2012-06-06 | Disposition: A | Discharge status: Home / Self Care | Payer: Self-pay | Attending: Emergency Medicine | Admitting: Emergency Medicine

## 2012-06-06 DIAGNOSIS — F17200 Nicotine dependence, unspecified, uncomplicated: Secondary | ICD-10-CM | POA: Insufficient documentation

## 2012-06-06 DIAGNOSIS — M546 Pain in thoracic spine: Secondary | ICD-10-CM | POA: Insufficient documentation

## 2012-06-06 DIAGNOSIS — M62838 Other muscle spasm: Secondary | ICD-10-CM

## 2012-06-06 DIAGNOSIS — M5380 Other specified dorsopathies, site unspecified: Secondary | ICD-10-CM | POA: Insufficient documentation

## 2012-06-06 DIAGNOSIS — M549 Dorsalgia, unspecified: Secondary | ICD-10-CM

## 2012-06-06 DIAGNOSIS — E119 Type 2 diabetes mellitus without complications: Secondary | ICD-10-CM | POA: Insufficient documentation

## 2012-06-06 MED ORDER — NAPROXEN 500 MG PO TABS: 500.0000 mg | ORAL_TABLET | Freq: Two times a day (BID) | ORAL | Status: DC

## 2012-06-06 MED ORDER — OXYCODONE-ACETAMINOPHEN 5-325 MG PO TABS: 1.0000 | ORAL_TABLET | Freq: Four times a day (QID) | ORAL | Status: AC | PRN

## 2012-06-06 MED ORDER — METHOCARBAMOL 500 MG PO TABS: 1000.0000 mg | ORAL_TABLET | Freq: Four times a day (QID) | ORAL | Status: AC

## 2012-06-06 NOTE — ED Provider Notes (Signed)
History     CSN: [id number]  Arrival date & time 06/06/12  1478   First MD Initiated Contact with Patient 06/06/12 1928      Chief Complaint  Patient presents with  . Back Pain    (Consider location/radiation/quality/duration/timing/severity/associated sxs/prior treatment) HPI Comments: Patient presents with right-sided middle back pain for the past 3 weeks that she sustained after falling on some stairs. Patient has noted a knot in the area. She has not had any weakness, numbness, or tingling in her arms. She states she is able to move her arms as she normally does. She's been treating at home with Aleve without improvement. She states the pain is getting worse and she is having trouble sleeping at night. Onset was acute. Course is gradually worsening. Nothing makes the pain better. Movement and palpation makes the pain worse.  Patient is a 51 y.o. female presenting with back pain. The history is provided by the patient.  Back Pain  This is a new problem. The current episode started more than 1 week ago. The problem occurs constantly. The problem has not changed since onset.The pain is associated with falling. The pain is present in the thoracic spine. The quality of the pain is described as aching. The pain does not radiate. The pain is moderate. The symptoms are aggravated by twisting and certain positions. Pertinent negatives include no fever, no numbness, no bowel incontinence, no perianal numbness, no bladder incontinence, no dysuria, no pelvic pain, no tingling and no weakness. She has tried NSAIDs for the symptoms. The treatment provided no relief.    Past Medical History  Diagnosis Date  . Diabetes mellitus     Past Surgical History  Procedure Date  . Tubal ligation   . Foot surgery     History reviewed. No pertinent family history.  History  Substance Use Topics  . Smoking status: Current Everyday Smoker -- 0.5 packs/day    Types: Cigarettes  . Smokeless tobacco: Not  on file  . Alcohol Use: No    OB History    Grav Para Term Preterm Abortions TAB SAB Ect Mult Living                  Review of Systems  Constitutional: Negative for fever and unexpected weight change.  Gastrointestinal: Negative for constipation and bowel incontinence.       Negative for fecal incontinence.   Genitourinary: Negative for bladder incontinence, dysuria, hematuria, flank pain, vaginal bleeding, vaginal discharge and pelvic pain.       Negative for urinary incontinence or retention.  Musculoskeletal: Positive for back pain.  Neurological: Negative for tingling, weakness and numbness.       Denies saddle paresthesias.    Allergies  Ibuprofen and Vicodin  Home Medications  No current outpatient prescriptions on file.  BP 150/83  Pulse 96  Temp 98.7 F (37.1 C) (Oral)  Resp 16  SpO2 100%  LMP 05/14/2012  Physical Exam  Nursing note and vitals reviewed. Constitutional: She appears well-developed and well-nourished.  HENT:  Head: Normocephalic and atraumatic.  Eyes: Conjunctivae are normal.  Neck: Normal range of motion. Neck supple.  Pulmonary/Chest: Effort normal.  Abdominal: Soft. There is no tenderness. There is no CVA tenderness.  Musculoskeletal: Normal range of motion.       Right shoulder: Normal. She exhibits normal range of motion.       Left shoulder: Normal. She exhibits normal range of motion.       Arms:  There is no tenderness to palpation over cervical/thoracic/lumbar/sacral spine. Tenderness to palpation over R thoracic paraspinal muscles, at border of scapula. No step-off noted with palpation of spine.   Neurological: She is alert. She has normal strength and normal reflexes. No sensory deficit.       5/5 strength in entire upper/lower extremities bilaterally. No sensation deficit.   Skin: Skin is warm and dry. No rash noted.  Psychiatric: She has a normal mood and affect.    ED Course  Procedures (including critical care  time)  Labs Reviewed - No data to display No results found.   1. Back pain   2. Muscle spasm     8:29 PM Patient seen and examined.   Vital signs reviewed and are as follows: Filed Vitals:   06/06/12 1916  BP: 150/83  Pulse: 96  Temp: 98.7 F (37.1 C)  Resp: 16   8:29 PM Patient was seen and examined. No red flag s/s of low back pain. Patient was counseled on back pain precautions and told to do activity as tolerated but do not lift, push, or pull heavy objects more than 10 pounds for the next week.  Patient counseled to use ice or heat on back for no longer than 15 minutes every hour.   Patient prescribed muscle relaxer and counseled on proper use of muscle relaxant medication.    Patient prescribed narcotic pain medicine and counseled on proper use of narcotic pain medications. Counseled not to combine this medication with others containing tylenol.   Urged patient not to drink alcohol, drive, or perform any other activities that requires focus while taking either of these medications.  Patient urged to follow-up with PCP if pain does not improve with treatment and rest or if pain becomes recurrent. Urged to return with worsening severe pain, loss of bowel or bladder control, trouble walking.   The patient verbalizes understanding and agrees with the plan.  MDM  Thoracic back pain, R border of scapula. No neurological deficits in upper or lower extremities. Full ROM of upper extremities. Do not suspect fx given good ROM.         Renne Crigler, [place] 06/08/12 1910

## 2012-06-06 NOTE — ED Notes (Signed)
Pt reports upper back pain x3 weeks - pt denies any known injury however states she did fall previously to onset of pain. Pain is progressively worse and worse w/ movement.

## 2012-06-06 NOTE — ED Notes (Signed)
Rx given x3 Pt ambulating independently w/ steady gait on d/c in no acute distress, A&Ox4. D/c instructions reviewed w/ pt - pt denies any further questions or concerns at present.   

## 2012-06-10 NOTE — ED Provider Notes (Signed)
Medical screening examination/treatment/procedure(s) were performed by non-physician practitioner and as supervising physician I was immediately available for consultation/collaboration.    Cowan Pilar R Chanay Nugent, MD 06/10/12 1110 

## 2012-09-11 ENCOUNTER — Emergency Department (HOSPITAL_COMMUNITY): Payer: Self-pay

## 2012-09-11 ENCOUNTER — Encounter (HOSPITAL_COMMUNITY): Payer: Self-pay

## 2012-09-11 ENCOUNTER — Emergency Department (HOSPITAL_COMMUNITY)
Admission: EM | Admit: 2012-09-11 | Discharge: 2012-09-11 | Disposition: A | Discharge status: Home / Self Care | Payer: Self-pay | Attending: Emergency Medicine | Admitting: Emergency Medicine

## 2012-09-11 DIAGNOSIS — F411 Generalized anxiety disorder: Secondary | ICD-10-CM | POA: Insufficient documentation

## 2012-09-11 DIAGNOSIS — R0789 Other chest pain: Secondary | ICD-10-CM | POA: Insufficient documentation

## 2012-09-11 DIAGNOSIS — F419 Anxiety disorder, unspecified: Secondary | ICD-10-CM

## 2012-09-11 DIAGNOSIS — E119 Type 2 diabetes mellitus without complications: Secondary | ICD-10-CM | POA: Insufficient documentation

## 2012-09-11 HISTORY — DX: Panic disorder (episodic paroxysmal anxiety): F41.0

## 2012-09-11 LAB — POCT I-STAT, CHEM 8
BUN: 15 mg/dL (ref 6–23)
Calcium, Ion: 1.14 mmol/L (ref 1.12–1.23)
Chloride: 110 mEq/L (ref 96–112)
Creatinine, Ser: 1.4 mg/dL — ABNORMAL HIGH (ref 0.50–1.10)
Glucose, Bld: 135 mg/dL — ABNORMAL HIGH (ref 70–99)

## 2012-09-11 LAB — POCT I-STAT TROPONIN I: Troponin i, poc: 0.01 ng/mL (ref 0.00–0.08)

## 2012-09-11 MED ORDER — ALBUTEROL SULFATE (5 MG/ML) 0.5% IN NEBU
2.5000 mg | INHALATION_SOLUTION | Freq: Once | RESPIRATORY_TRACT | Status: AC
  Administered 2012-09-11: 2.5 mg via RESPIRATORY_TRACT
  Filled 2012-09-11: qty 1

## 2012-09-11 NOTE — ED Notes (Signed)
Pt reports given HHN for panic attack.  Pt states she is out of med

## 2012-09-11 NOTE — ED Provider Notes (Signed)
History  This chart was scribed for Carol Carrel, PA-C working with Doug Sou, MD by Bennett Scrape. This patient was seen in room WTR7/WTR7 and the patient's care was started at 8:20PM.  CSN: [id number]  Arrival date & time 09/11/12  1911   First MD Initiated Contact with Patient 08/29/12  2020  Chief Complaint  Patient presents with  . Panic Attack     The history is provided by the patient. No language interpreter was used.    GWYNEVERE Hardy is a 51 y.o. female who presents to the Emergency Department complaining of a sudden onset, gradually improving, constant panic attack consisting of palpitations, chest tightness and SOB that started while she was laying in bed approximately one hour ago. She states that she has a h/o panic attacks, her last episode being last year. She denies following up with a MD for her symptoms stating that she has been using albuterol inhalers that she gets from EDs to improved her symptoms when they come one. She reports that the symptoms resolved after a breathing treatment in the ED and she is unsure how long the attack lasted. She reports two close deaths in her family recently and denies that the symptoms were brought on by exercise or exertion. She denies SI or HI. She denies nausea and diaphoresis as associated symptoms. She also has a h/o DM. She is a current everyday smoker (1/3 pack per day) but denies alcohol use.    Past Medical History  Diagnosis Date  . Diabetes mellitus   . Panic attack     Past Surgical History  Procedure Date  . Tubal ligation   . Foot surgery     No family history on file.  History  Substance Use Topics  . Smoking status: Current Every Day Smoker -- 0.5 packs/day    Types: Cigarettes  . Smokeless tobacco: Not on file  . Alcohol Use: No   No OB history provided.  Review of Systems  Respiratory: Positive for chest tightness and shortness of breath. Negative for cough.   Cardiovascular: Positive for  palpitations. Negative for chest pain.  Gastrointestinal: Negative for nausea and vomiting.  Psychiatric/Behavioral: Negative for suicidal ideas.  All other systems reviewed and are negative.    Allergies  Ibuprofen and Vicodin  Home Medications  No current outpatient prescriptions on file.  Triage Vitals: BP 154/83  Pulse 92  Temp 98.1 F (36.7 C) (Oral)  Resp 25  SpO2 100%  LMP 08/30/2012  Physical Exam  Nursing note and vitals reviewed. Constitutional: She is oriented to person, place, and time. She appears well-developed and well-nourished. No distress.  HENT:  Head: Normocephalic and atraumatic.  Eyes: Conjunctivae normal and EOM are normal. Pupils are equal, round, and reactive to light.  Neck: Normal range of motion. Neck supple. Normal carotid pulses and no JVD present. Carotid bruit is not present. No rigidity. Erythema present. No tracheal deviation and normal range of motion present.  Cardiovascular: Normal rate, regular rhythm, S1 normal, S2 normal, normal heart sounds, intact distal pulses and normal pulses.  PMI is not displaced.  Exam reveals no gallop and no friction rub.   No murmur heard.      Heart sounds are normal to ausculation  Pulmonary/Chest: Effort normal and breath sounds normal. No accessory muscle usage or stridor. No respiratory distress. She exhibits no tenderness and no bony tenderness.       Normal accessory muscle use  Abdominal: Bowel sounds are normal.  Soft non tender. Non pulsatile aorta.   Musculoskeletal: Normal range of motion.  Neurological: She is alert and oriented to person, place, and time.  Skin: Skin is warm, dry and intact. No rash noted. She is not diaphoretic. No cyanosis. Nails show no clubbing.  Psychiatric: She has a normal mood and affect. Her behavior is normal.    ED Course  Procedures (including critical care time)  DIAGNOSTIC STUDIES: Oxygen Saturation is 100% on room air, normal by my interpretation.     COORDINATION OF CARE: 8:46PM-Discussed treatment plan with pt at bedside and pt agreed to plan. Pt states that she is not ready to discuss her symptoms with a psychiatrist.    Labs Reviewed  GLUCOSE, CAPILLARY - Abnormal; Notable for the following:    Glucose-Capillary 130 (*)     All other components within normal limits   No results found.   No diagnosis found.    Date: 09/11/2012  Rate: 84  Rhythm: normal sinus rhythm  QRS Axis: normal  Intervals: normal  ST/T Wave abnormalities: borderline t wave abnormalities   Conduction Disutrbances: none  Narrative Interpretation:   Old EKG Reviewed: No significant changes noted    MDM  Anxiety panic attack  Patient presents to the emergency department complaining of symptoms consistent with anxiety.  Patient has a history of same with similar episodes.  The patient is resting comfortably, in no apparent distress and asymptomatic.  Labs, ECG and vital signs reviewed, pt has to leave d/t personal matters & does not want to wait for troponin results.  Stress reducing mechanisms discussed including caffeine intake.  Patient has been referred to psychiatric services for follow-up. Strict return precautions for development of chest pain.    I personally performed the services described in this documentation, which was scribed in my presence. The recorded information has been reviewed and considered.    Carol Hardy, [place] 09/11/12 2141

## 2012-09-11 NOTE — ED Notes (Signed)
Pt presents with NAD- Pt c/o of typical panic attack- watching TV when episode began- Pt reports attack is not constant- No pain

## 2012-09-11 NOTE — ED Notes (Signed)
Patient given discharge instructions, information, prescriptions, and diet order. Patient states that they adequately understand discharge information given and to return to ED if symptoms return or worsen.     

## 2012-09-12 NOTE — ED Provider Notes (Signed)
Medical screening examination/treatment/procedure(s) were performed by non-physician practitioner and as supervising physician I was immediately available for consultation/collaboration.  Shantel Wesely, MD 09/12/12 0121 

## 2013-03-17 ENCOUNTER — Inpatient Hospital Stay (HOSPITAL_COMMUNITY)
Admission: AD | Admit: 2013-03-17 | Discharge: 2013-03-17 | Disposition: A | Discharge status: Home / Self Care | Payer: Self-pay | Source: Ambulatory Visit | Attending: Obstetrics & Gynecology | Admitting: Obstetrics & Gynecology

## 2013-03-17 ENCOUNTER — Encounter (HOSPITAL_COMMUNITY): Payer: Self-pay | Admitting: *Deleted

## 2013-03-17 ENCOUNTER — Encounter: Payer: Self-pay | Admitting: Advanced Practice Midwife

## 2013-03-17 ENCOUNTER — Inpatient Hospital Stay (HOSPITAL_COMMUNITY): Payer: Self-pay

## 2013-03-17 DIAGNOSIS — D259 Leiomyoma of uterus, unspecified: Secondary | ICD-10-CM | POA: Insufficient documentation

## 2013-03-17 DIAGNOSIS — N390 Urinary tract infection, site not specified: Secondary | ICD-10-CM | POA: Insufficient documentation

## 2013-03-17 LAB — CBC
HCT: 37.4 % (ref 36.0–46.0)
MCV: 70.3 fL — ABNORMAL LOW (ref 78.0–100.0)
Platelets: 227 10*3/uL (ref 150–400)
RBC: 5.32 MIL/uL — ABNORMAL HIGH (ref 3.87–5.11)
WBC: 10.1 10*3/uL (ref 4.0–10.5)

## 2013-03-17 LAB — URINALYSIS, ROUTINE W REFLEX MICROSCOPIC
Bilirubin Urine: NEGATIVE
Protein, ur: 100 mg/dL — AB
Urobilinogen, UA: 0.2 mg/dL (ref 0.0–1.0)

## 2013-03-17 LAB — GC/CHLAMYDIA PROBE AMP: GC Probe RNA: NEGATIVE

## 2013-03-17 LAB — WET PREP, GENITAL
Clue Cells Wet Prep HPF POC: NONE SEEN
Trich, Wet Prep: NONE SEEN

## 2013-03-17 MED ORDER — CIPROFLOXACIN HCL 500 MG PO TABS: 500.0000 mg | ORAL_TABLET | Freq: Two times a day (BID) | ORAL | Status: AC

## 2013-03-17 NOTE — MAU Note (Signed)
Pt states "i have been having funny feelings in my stomach". Reports burning with urination

## 2013-03-17 NOTE — Progress Notes (Signed)
Pt states she has a funny feeling that she can not discribe. Pt states it is a soreness

## 2013-03-17 NOTE — MAU Note (Signed)
Pt states she states she is having burning pt states she feels "like she is hemorhaging  Inside. Pt states she has had this stomach discomfort since March. Pt states she has been having irregular periods since January.

## 2013-03-17 NOTE — MAU Provider Note (Signed)
Chief Complaint: Dysuria   First Provider Initiated Contact with Patient 03/17/13 [phone]     SUBJECTIVE HPI: Carol Hardy is a 52 y.o. G2P0020 non-pregnant female who presents with: 1. Low abd cramping x a few days.  2. "Funny feelings", pressure and feeling as if she is hemorrhaging in her mid abd x 1 month. Not painful. Unable to describe why it feels as if she hemorrhaging. 3. Reports burning with urination x 3 days.  Past Medical History  Diagnosis Date  . Diabetes mellitus   . Panic attack    OB History   Grav Para Term Preterm Abortions TAB SAB Ect Mult Living   2 0 0  2   2       # Outc Date GA Lbr Len/2nd Wgt Sex Del Anes PTL Lv   1 ECT            2 ECT              Past Surgical History  Procedure Laterality Date  . Tubal ligation    . Foot surgery     History   Social History  . Marital Status: Legally Separated    Spouse Name: N/A    Number of Children: N/A  . Years of Education: N/A   Occupational History  . Not on file.   Social History Main Topics  . Smoking status: Current Every Day Smoker -- 0.50 packs/day    Types: Cigarettes  . Smokeless tobacco: Not on file  . Alcohol Use: No  . Drug Use: No  . Sexually Active: Not on file   Other Topics Concern  . Not on file   Social History Narrative  . No narrative on file   No current facility-administered medications on file prior to encounter.   No current outpatient prescriptions on file prior to encounter.   Allergies  Allergen Reactions  . Ibuprofen Other (See Comments)    Affected kinney   . Vicodin (Hydrocodone-Acetaminophen) Itching    ROS: Pos for irreg menses (missed period in February), frequency. Neg for fever, chills, vaginal discharge, hematuria, flank pain, dyspareunia, GI complaints.  OBJECTIVE Blood pressure 166/85, pulse 94, temperature 98.2 F (36.8 C), temperature source Oral, resp. rate 20, height 5\' 2"  (1.575 m), weight 69.854 kg (154 lb), last menstrual period  02/28/2013, SpO2 100.00%. 151/85 GENERAL: Well-developed, well-nourished female in no acute distress. ? Intoxicated vs cognitively impaired. Poor hygiene .  HEENT: Normocephalic HEART: normal rate RESP: normal effort ABDOMEN: Soft, non-tender. Firm, NT mass from SP to 4/U. Pos BS x 4. BACK: No CVAT EXTREMITIES: Nontender, no edema NEURO: Alert and oriented SPECULUM EXAM: NEFG, physiologic discharge, no blood noted, cervix clean BIMANUAL: cervix closed; uterus extremely enlarged, no adnexal tenderness or masses.  LAB RESULTS Results for orders placed during the hospital encounter of 03/17/13 (from the past 24 hour(s))  URINALYSIS, ROUTINE W REFLEX MICROSCOPIC     Status: Abnormal   Collection Time    03/17/13  1:30 AM      Result Value Range   Color, Urine YELLOW  YELLOW   APPearance CLEAR  CLEAR   Specific Gravity, Urine 1.025  1.005 - 1.030   pH 6.0  5.0 - 8.0   Glucose, UA 100 (*) NEGATIVE mg/dL   Hgb urine dipstick SMALL (*) NEGATIVE   Bilirubin Urine NEGATIVE  NEGATIVE   Ketones, ur NEGATIVE  NEGATIVE mg/dL   Protein, ur 960 (*) NEGATIVE mg/dL   Urobilinogen, UA 0.2  0.0 -  1.0 mg/dL   Nitrite NEGATIVE  NEGATIVE   Leukocytes, UA NEGATIVE  NEGATIVE  URINE MICROSCOPIC-ADD ON     Status: None   Collection Time    03/17/13  1:30 AM      Result Value Range   Squamous Epithelial / LPF RARE  RARE   WBC, UA 0-2  <3 WBC/hpf   RBC / HPF 0-2  <3 RBC/hpf   Bacteria, UA RARE  RARE  POCT PREGNANCY, URINE     Status: None   Collection Time    03/17/13  1:59 AM      Result Value Range   Preg Test, Ur NEGATIVE  NEGATIVE  CBC     Status: Abnormal   Collection Time    03/17/13  3:30 AM      Result Value Range   WBC 10.1  4.0 - 10.5 K/uL   RBC 5.32 (*) 3.87 - 5.11 MIL/uL   Hemoglobin 12.0  12.0 - 15.0 g/dL   HCT 16.1  09.6 - 04.5 %   MCV 70.3 (*) 78.0 - 100.0 fL   MCH 22.6 (*) 26.0 - 34.0 pg   MCHC 32.1  30.0 - 36.0 g/dL   RDW 40.9  81.1 - 91.4 %   Platelets 227  150 - 400 K/uL   WET PREP, GENITAL     Status: Abnormal   Collection Time    03/17/13  4:00 AM      Result Value Range   Yeast Wet Prep HPF POC NONE SEEN  NONE SEEN   Trich, Wet Prep NONE SEEN  NONE SEEN   Clue Cells Wet Prep HPF POC NONE SEEN  NONE SEEN   WBC, Wet Prep HPF POC FEW (*) NONE SEEN    IMAGING US Transvaginal Non-ob  03/17/2013  *RADIOLOGY REPORT*  Clinical Data: Abdominal pain and mass.  TRANSABDOMINAL AND TRANSVAGINAL ULTRASOUND OF PELVIS Technique:  Both transabdominal and transvaginal ultrasound examinations of the pelvis were performed. Transabdominal technique was performed for global imaging of the pelvis including uterus, ovaries, adnexal regions, and pelvic cul-de-sac.  It was necessary to proceed with endovaginal exam following the transabdominal exam to visualize the uterus and ovaries in greater detail.  Comparison:  CT of the abdomen and pelvis performed 05/13/2007  Findings:  Uterus: Grossly normal in size; measures 9.0 x 3.5 x 4.4 cm. Several intramural myomata are seen, measuring up to 1.7 cm in size.  There is also one very large 13.2 x 10.2 x 12.8 cm exophytic fibroid seen abutting the posterior aspect of the fundus.  This has increased in size from 2008.  Endometrium: Normal in thickness and appearance; measures 0.3 cm in thickness.  Right ovary:  Normal appearance/no adnexal mass; measures 2.7 x 1.7 x 1.5 cm.  Left ovary: Normal appearance; measures 3.2 x 1.5 x 1.5 cm. A left- sided follicle is normal in appearance; it demonstrates internal echoes on transvaginal imaging due to artifact.  Other findings: No free fluid is seen within the pelvic cul-de-sac.  IMPRESSION:  1.  Very large 13.2 x 10.2 x 12.8 cm exophytic fibroid noted abutting the posterior aspect of the uterine fundus.  This has increased in size from 9.0 cm in 2008. 2.  Scattered small intramural fibroids noted within the uterus; uterus otherwise unremarkable in appearance.  Ovaries within normal limits.   Original Report  Authenticated By: Tonia Ghent, M.D.    US Pelvis Complete  03/17/2013  *RADIOLOGY REPORT*  Clinical Data: Abdominal pain and mass.  TRANSABDOMINAL  AND TRANSVAGINAL ULTRASOUND OF PELVIS Technique:  Both transabdominal and transvaginal ultrasound examinations of the pelvis were performed. Transabdominal technique was performed for global imaging of the pelvis including uterus, ovaries, adnexal regions, and pelvic cul-de-sac.  It was necessary to proceed with endovaginal exam following the transabdominal exam to visualize the uterus and ovaries in greater detail.  Comparison:  CT of the abdomen and pelvis performed 05/13/2007  Findings:  Uterus: Grossly normal in size; measures 9.0 x 3.5 x 4.4 cm. Several intramural myomata are seen, measuring up to 1.7 cm in size.  There is also one very large 13.2 x 10.2 x 12.8 cm exophytic fibroid seen abutting the posterior aspect of the fundus.  This has increased in size from 2008.  Endometrium: Normal in thickness and appearance; measures 0.3 cm in thickness.  Right ovary:  Normal appearance/no adnexal mass; measures 2.7 x 1.7 x 1.5 cm.  Left ovary: Normal appearance; measures 3.2 x 1.5 x 1.5 cm. A left- sided follicle is normal in appearance; it demonstrates internal echoes on transvaginal imaging due to artifact.  Other findings: No free fluid is seen within the pelvic cul-de-sac.  IMPRESSION:  1.  Very large 13.2 x 10.2 x 12.8 cm exophytic fibroid noted abutting the posterior aspect of the uterine fundus.  This has increased in size from 9.0 cm in 2008. 2.  Scattered small intramural fibroids noted within the uterus; uterus otherwise unremarkable in appearance.  Ovaries within normal limits.   Original Report Authenticated By: Tonia Ghent, M.D.    MAU COURSE  ASSESSMENT 1. UTI (lower urinary tract infection)   2. Fibroid uterus    PLAN Discharge home     Follow-up Information   Follow up with [facility]. (for gynecology care as needed)     Contact information:   [address] [place] [address] [phone]       Medication List    TAKE these medications       ciprofloxacin 500 MG tablet  Commonly known as:  CIPRO  Take 1 tablet (500 mg total) by mouth 2 (two) times daily.       Fleming, CNM 03/17/2013  5:43 AM

## 2013-03-18 NOTE — MAU Provider Note (Signed)
Attestation of Attending Supervision of Advanced Practitioner (PA/CNM/NP): Evaluation and management procedures were performed by the Advanced Practitioner under my supervision and collaboration.  I have reviewed the Advanced Practitioner's note and chart, and I agree with the management and plan.  Antoninette Lerner, MD, FACOG Attending Obstetrician & Gynecologist Faculty Practice, [facility]  

## 2013-03-21 ENCOUNTER — Encounter: Payer: Self-pay | Admitting: Obstetrics & Gynecology

## 2013-04-17 ENCOUNTER — Encounter: Payer: Self-pay | Admitting: Obstetrics & Gynecology

## 2013-09-10 ENCOUNTER — Emergency Department (HOSPITAL_COMMUNITY): Payer: Self-pay

## 2013-09-10 ENCOUNTER — Encounter (HOSPITAL_COMMUNITY): Payer: Self-pay | Admitting: Emergency Medicine

## 2013-09-10 ENCOUNTER — Emergency Department (HOSPITAL_COMMUNITY)
Admission: EM | Admit: 2013-09-10 | Discharge: 2013-09-10 | Disposition: A | Discharge status: Home / Self Care | Payer: Self-pay | Attending: Emergency Medicine | Admitting: Emergency Medicine

## 2013-09-10 DIAGNOSIS — R0602 Shortness of breath: Secondary | ICD-10-CM | POA: Insufficient documentation

## 2013-09-10 DIAGNOSIS — F411 Generalized anxiety disorder: Secondary | ICD-10-CM | POA: Insufficient documentation

## 2013-09-10 DIAGNOSIS — F419 Anxiety disorder, unspecified: Secondary | ICD-10-CM

## 2013-09-10 DIAGNOSIS — R079 Chest pain, unspecified: Secondary | ICD-10-CM

## 2013-09-10 DIAGNOSIS — E119 Type 2 diabetes mellitus without complications: Secondary | ICD-10-CM | POA: Insufficient documentation

## 2013-09-10 DIAGNOSIS — R42 Dizziness and giddiness: Secondary | ICD-10-CM | POA: Insufficient documentation

## 2013-09-10 DIAGNOSIS — R000 Tachycardia, unspecified: Secondary | ICD-10-CM | POA: Insufficient documentation

## 2013-09-10 DIAGNOSIS — F17200 Nicotine dependence, unspecified, uncomplicated: Secondary | ICD-10-CM | POA: Insufficient documentation

## 2013-09-10 DIAGNOSIS — Z79899 Other long term (current) drug therapy: Secondary | ICD-10-CM | POA: Insufficient documentation

## 2013-09-10 DIAGNOSIS — I10 Essential (primary) hypertension: Secondary | ICD-10-CM

## 2013-09-10 LAB — CBC WITH DIFFERENTIAL/PLATELET
Basophils Absolute: 0 10*3/uL (ref 0.0–0.1)
Eosinophils Absolute: 0.4 10*3/uL (ref 0.0–0.7)
Lymphs Abs: 3.8 10*3/uL (ref 0.7–4.0)
MCHC: 32.4 g/dL (ref 30.0–36.0)
MCV: 69.6 fL — ABNORMAL LOW (ref 78.0–100.0)
Monocytes Relative: 5 % (ref 3–12)
Platelets: 262 10*3/uL (ref 150–400)
RDW: 14.7 % (ref 11.5–15.5)
WBC: 12.3 10*3/uL — ABNORMAL HIGH (ref 4.0–10.5)

## 2013-09-10 LAB — POCT I-STAT, CHEM 8
Calcium, Ion: 1.18 mmol/L (ref 1.12–1.23)
Creatinine, Ser: 1.8 mg/dL — ABNORMAL HIGH (ref 0.50–1.10)
Glucose, Bld: 153 mg/dL — ABNORMAL HIGH (ref 70–99)
HCT: 45 % (ref 36.0–46.0)
Hemoglobin: 15.3 g/dL — ABNORMAL HIGH (ref 12.0–15.0)
Potassium: 4.4 mEq/L (ref 3.5–5.1)
TCO2: 23 mmol/L (ref 0–100)

## 2013-09-10 MED ORDER — ALBUTEROL SULFATE HFA 108 (90 BASE) MCG/ACT IN AERS
1.0000 | INHALATION_SPRAY | RESPIRATORY_TRACT | Status: DC | PRN
  Filled 2013-09-10: qty 6.7

## 2013-09-10 MED ORDER — HYDROCHLOROTHIAZIDE 25 MG PO TABS: 25.0000 mg | ORAL_TABLET | Freq: Every day | ORAL | Status: DC

## 2013-09-10 MED ORDER — ALBUTEROL SULFATE HFA 108 (90 BASE) MCG/ACT IN AERS: 2.0000 | INHALATION_SPRAY | RESPIRATORY_TRACT | Status: DC | PRN

## 2013-09-10 NOTE — ED Notes (Signed)
Pt from home reports that she has had CP x3weeks with sharp pains in her R arm. Pt states that she has hx of anxiety attacks. Pt denies N/V/D, but reports SOB, dizziness. Pt is A&O and in NAD

## 2013-09-10 NOTE — ED Provider Notes (Signed)
CSN: [id number]     Arrival date & time 09/10/13  1339 History   First MD Initiated Contact with Patient 09/10/13 1402     Chief Complaint  Patient presents with  . Chest Pain  . Anxiety   (Consider location/radiation/quality/duration/timing/severity/associated sxs/prior Treatment) HPI Comments: Pt states the sx got really bad yesterday and had a panic attack and she called EMS who checked her pressure and it was really high.  She had not had another panic attack today but has been still having CP and dizziness and SOB.  She denies SI/HI.  Has been under a lot of stress lately.  Patient is a 52 y.o. female presenting with chest pain and anxiety. The history is provided by the patient.  Chest Pain Pain location:  R chest Pain quality: pressure, sharp and shooting   Pain radiates to:  R arm Pain radiates to the back: no   Pain severity:  Moderate Onset quality:  Sudden Timing:  Constant Progression:  Waxing and waning Chronicity:  Recurrent Context: stress   Relieved by:  Nothing Exacerbated by: stress. Associated symptoms: anxiety, dizziness and shortness of breath   Associated symptoms: no abdominal pain, no altered mental status, no cough, no fever, no nausea, no syncope, not vomiting and no weakness   Risk factors: diabetes mellitus   Risk factors comment:  Anxiety Anxiety Associated symptoms include chest pain and shortness of breath. Pertinent negatives include no abdominal pain.    Past Medical History  Diagnosis Date  . Diabetes mellitus   . Panic attack    Past Surgical History  Procedure Laterality Date  . Tubal ligation    . Foot surgery     Family History  Problem Relation Age of Onset  . Diabetes Father    History  Substance Use Topics  . Smoking status: Current Every Day Smoker -- 0.50 packs/day    Types: Cigarettes  . Smokeless tobacco: Not on file  . Alcohol Use: No   OB History   Grav Para Term Preterm Abortions TAB SAB Ect Mult Living   2 0 0   2   2       Review of Systems  Constitutional: Negative for fever.  Respiratory: Positive for shortness of breath. Negative for cough.   Cardiovascular: Positive for chest pain. Negative for syncope.  Gastrointestinal: Negative for nausea, vomiting and abdominal pain.  Genitourinary: Negative for dysuria.  Neurological: Positive for dizziness. Negative for weakness.  Psychiatric/Behavioral: The patient is nervous/anxious.   All other systems reviewed and are negative.    Allergies  Ibuprofen and Vicodin  Home Medications   Current Outpatient Rx  Name  Route  Sig  Dispense  Refill  . naproxen sodium (ANAPROX) 220 MG tablet   Oral   Take 220 mg by mouth 2 (two) times daily with a meal.         . hydrochlorothiazide (HYDRODIURIL) 25 MG tablet   Oral   Take 1 tablet (25 mg total) by mouth daily.   30 tablet   1    BP 150/87  Pulse 105  Temp(Src) 98.5 F (36.9 C) (Oral)  Resp 20  SpO2 98%  LMP 08/30/2013 Physical Exam  Nursing note and vitals reviewed. Constitutional: She is oriented to person, place, and time. She appears well-developed and well-nourished. No distress.  HENT:  Head: Normocephalic and atraumatic.  Mouth/Throat: Oropharynx is clear and moist.  Eyes: Conjunctivae and EOM are normal. Pupils are equal, round, and reactive to light.  Neck: Normal range of motion. Neck supple.  Cardiovascular: Regular rhythm and intact distal pulses.  Tachycardia present.   No murmur heard. Pulmonary/Chest: Effort normal and breath sounds normal. No respiratory distress. She has no wheezes. She has no rales. She exhibits tenderness.    Abdominal: Soft. She exhibits no distension. There is no tenderness. There is no rebound and no guarding.  Musculoskeletal: Normal range of motion. She exhibits no edema and no tenderness.  Neurological: She is alert and oriented to person, place, and time.  Skin: Skin is warm and dry. No rash noted. No erythema.  Psychiatric: Her  behavior is normal. Her mood appears anxious.    ED Course  Procedures (including critical care time) Labs Review Labs Reviewed  CBC WITH DIFFERENTIAL - Abnormal; Notable for the following:    WBC 12.3 (*)    RBC 5.85 (*)    MCV 69.6 (*)    MCH 22.6 (*)    All other components within normal limits  POCT I-STAT, CHEM 8 - Abnormal; Notable for the following:    Creatinine, Ser 1.80 (*)    Glucose, Bld 153 (*)    Hemoglobin 15.3 (*)    All other components within normal limits  D-DIMER, QUANTITATIVE  POCT I-STAT TROPONIN I   Imaging Review Dg Chest 2 View  09/10/2013   CLINICAL DATA:  Chest pain  EXAM: CHEST  2 VIEW  COMPARISON:  09/11/2012 and prior chest radiographs  FINDINGS: The cardiomediastinal silhouette is unremarkable.  There is no evidence of focal airspace disease, pulmonary edema, suspicious pulmonary nodule/mass, pleural effusion, or pneumothorax.  No acute bony abnormalities are identified.  Posttraumatic/ surgical changes of the distal right clavicle again noted.  IMPRESSION: No evidence of active cardiopulmonary disease.   Electronically Signed   By: Laveda Abbe M.D.   On: 09/10/2013 14:48    EKG Interpretation   None       Date: 09/10/2013  Rate: 101  Rhythm: sinus tachycardia  QRS Axis: normal  Intervals: normal  ST/T Wave abnormalities: normal  Conduction Disutrbances:none  Narrative Interpretation:   Old EKG Reviewed: unchanged   MDM   1. Chest pain   2. Hypertension   3. Anxiety     Patient here presents with symptoms most consistent with anxiety. She states she has episodes where she feels very anxious, shortness of breath, dizziness and right-sided chest pain that makes her right arm tingle. She had an episode yesterday where she called EMS and they came out and her blood pressure was elevated and she was very anxious and having panic attack. Here should still admits to being anxious but denies having a panic attack per her pressure is improved at  150/80 in looking back over the last few years that is her typical blood pressure for her. Initially when she arrived she was mildly tachycardic at 103 the 105 O2 sats without intervention to the 90s. Patient denies any infectious etiology she denies any cardiac history, drug use but does smoke. She states in the past when she had similar episodes they gave her an inhaler and it helped her.  Low risk wells and low risk chest pain.  Risk factors but no prior cardiac hx.  EKG wnl.  Troponin neg and since pain has been going on for 3 weeks fairly constantly unlikely to be cardiac related.   CBC wnl.  CMP with evidence of mild worsening Cr to 1.8.  Discussed with pt f/u with PCP and given referrals.  Pt  also will be started on HCTZ and inhaler.  Pt has been consistently HTN for the last 3 years and not on meds.    Gwyneth Sprout, MD 09/10/13 1553

## 2014-01-04 ENCOUNTER — Emergency Department (HOSPITAL_COMMUNITY): Payer: Self-pay

## 2014-01-04 ENCOUNTER — Emergency Department (HOSPITAL_COMMUNITY)
Admission: EM | Admit: 2014-01-04 | Discharge: 2014-01-04 | Disposition: A | Discharge status: Home / Self Care | Payer: Self-pay | Attending: Emergency Medicine | Admitting: Emergency Medicine

## 2014-01-04 ENCOUNTER — Encounter (HOSPITAL_COMMUNITY): Payer: Self-pay | Admitting: Emergency Medicine

## 2014-01-04 DIAGNOSIS — J40 Bronchitis, not specified as acute or chronic: Secondary | ICD-10-CM

## 2014-01-04 DIAGNOSIS — Z8659 Personal history of other mental and behavioral disorders: Secondary | ICD-10-CM | POA: Insufficient documentation

## 2014-01-04 DIAGNOSIS — IMO0001 Reserved for inherently not codable concepts without codable children: Secondary | ICD-10-CM

## 2014-01-04 DIAGNOSIS — J209 Acute bronchitis, unspecified: Secondary | ICD-10-CM | POA: Insufficient documentation

## 2014-01-04 DIAGNOSIS — R030 Elevated blood-pressure reading, without diagnosis of hypertension: Secondary | ICD-10-CM | POA: Insufficient documentation

## 2014-01-04 DIAGNOSIS — E119 Type 2 diabetes mellitus without complications: Secondary | ICD-10-CM | POA: Insufficient documentation

## 2014-01-04 DIAGNOSIS — F17200 Nicotine dependence, unspecified, uncomplicated: Secondary | ICD-10-CM | POA: Insufficient documentation

## 2014-01-04 LAB — CBC WITH DIFFERENTIAL/PLATELET
BASOS PCT: 0 % (ref 0–1)
Basophils Absolute: 0 10*3/uL (ref 0.0–0.1)
EOS ABS: 0.5 10*3/uL (ref 0.0–0.7)
EOS PCT: 5 % (ref 0–5)
HCT: 39.7 % (ref 36.0–46.0)
HEMOGLOBIN: 12.7 g/dL (ref 12.0–15.0)
LYMPHS PCT: 38 % (ref 12–46)
Lymphs Abs: 3.9 10*3/uL (ref 0.7–4.0)
MCH: 22.6 pg — ABNORMAL LOW (ref 26.0–34.0)
MCHC: 32 g/dL (ref 30.0–36.0)
MCV: 70.6 fL — ABNORMAL LOW (ref 78.0–100.0)
MONOS PCT: 7 % (ref 3–12)
Monocytes Absolute: 0.7 10*3/uL (ref 0.1–1.0)
NEUTROS PCT: 50 % (ref 43–77)
Neutro Abs: 5.1 10*3/uL (ref 1.7–7.7)
Platelets: 270 10*3/uL (ref 150–400)
RBC: 5.62 MIL/uL — AB (ref 3.87–5.11)
RDW: 14.9 % (ref 11.5–15.5)
WBC: 10.2 10*3/uL (ref 4.0–10.5)

## 2014-01-04 LAB — BASIC METABOLIC PANEL
BUN: 16 mg/dL (ref 6–23)
CO2: 19 meq/L (ref 19–32)
Calcium: 9.1 mg/dL (ref 8.4–10.5)
Chloride: 103 mEq/L (ref 96–112)
Creatinine, Ser: 1.53 mg/dL — ABNORMAL HIGH (ref 0.50–1.10)
GFR calc Af Amer: 45 mL/min — ABNORMAL LOW (ref >90)
GFR, EST NON AFRICAN AMERICAN: 39 mL/min — AB (ref >90)
GLUCOSE: 110 mg/dL — AB (ref 70–99)
POTASSIUM: 5.1 meq/L (ref 3.7–5.3)
SODIUM: 136 meq/L — AB (ref 137–147)

## 2014-01-04 LAB — PRO B NATRIURETIC PEPTIDE: Pro B Natriuretic peptide (BNP): 508.3 pg/mL — ABNORMAL HIGH (ref 0–125)

## 2014-01-04 MED ORDER — BENZONATATE 100 MG PO CAPS
100.0000 mg | ORAL_CAPSULE | Freq: Once | ORAL | Status: AC
  Administered 2014-01-04: 100 mg via ORAL
  Filled 2014-01-04: qty 1

## 2014-01-04 MED ORDER — GUAIFENESIN-DM 100-10 MG/5ML PO SYRP: 5.0000 mL | ORAL_SOLUTION | ORAL | Status: AC | PRN

## 2014-01-04 MED ORDER — AZITHROMYCIN 250 MG PO TABS: 250.0000 mg | ORAL_TABLET | Freq: Every day | ORAL | Status: AC

## 2014-01-04 MED ORDER — ALBUTEROL SULFATE HFA 108 (90 BASE) MCG/ACT IN AERS
2.0000 | INHALATION_SPRAY | Freq: Once | RESPIRATORY_TRACT | Status: AC
  Administered 2014-01-04: 2 via RESPIRATORY_TRACT
  Filled 2014-01-04: qty 6.7

## 2014-01-04 NOTE — ED Notes (Signed)
Per pt, week and half ago.  Pt started with dry cough, congestion .  Pt taking meds at home without relief. Pt is hoarse upon assessment but denies sore throat.  No shortness of breath. Pt denies asthma.  Pt is a smoker with no history of COPD>

## 2014-01-04 NOTE — ED Notes (Signed)
PA at bedside.

## 2014-01-04 NOTE — Discharge Instructions (Signed)
Use inhaler 2 puffs every 4-6 hours  Use cough medicine as needed for cough Take antibiotics for full dose  Follow-up with your doctor - see resource list - recheck blood pressure Return to the emergency department if you develop any changing/worsening condition, coughing up blood, chest pain, shortness of breath/difficulty breathing, fever, repeated vomiting, or any other concerns (please read additional information regarding your condition below)   Bronchitis Bronchitis is inflammation of the airways that extend from the windpipe into the lungs (bronchi). The inflammation often causes mucus to develop, which leads to a cough. If the inflammation becomes severe, it may cause shortness of breath. CAUSES  Bronchitis may be caused by:   Viral infections.   Bacteria.   Cigarette smoke.   Allergens, pollutants, and other irritants.  SIGNS AND SYMPTOMS  The most common symptom of bronchitis is a frequent cough that produces mucus. Other symptoms include:  Fever.   Body aches.   Chest congestion.   Chills.   Shortness of breath.   Sore throat.  DIAGNOSIS  Bronchitis is usually diagnosed through a medical history and physical exam. Tests, such as chest X-rays, are sometimes done to rule out other conditions.  TREATMENT  You may need to avoid contact with whatever caused the problem (smoking, for example). Medicines are sometimes needed. These may include:  Antibiotics. These may be prescribed if the condition is caused by bacteria.  Cough suppressants. These may be prescribed for relief of cough symptoms.   Inhaled medicines. These may be prescribed to help open your airways and make it easier for you to breathe.   Steroid medicines. These may be prescribed for those with recurrent (chronic) bronchitis. HOME CARE INSTRUCTIONS  Get plenty of rest.   Drink enough fluids to keep your urine clear or pale yellow (unless you have a medical condition that requires  fluid restriction). Increasing fluids may help thin your secretions and will prevent dehydration.   Only take over-the-counter or prescription medicines as directed by your health care provider.  Only take antibiotics as directed. Make sure you finish them even if you start to feel better.  Avoid secondhand smoke, irritating chemicals, and strong fumes. These will make bronchitis worse. If you are a smoker, quit smoking. Consider using nicotine gum or skin patches to help control withdrawal symptoms. Quitting smoking will help your lungs heal faster.   Put a cool-mist humidifier in your bedroom at night to moisten the air. This may help loosen mucus. Change the water in the humidifier daily. You can also run the hot water in your shower and sit in the bathroom with the door closed for 5 10 minutes.   Follow up with your health care provider as directed.   Wash your hands frequently to avoid catching bronchitis again or spreading an infection to others.  SEEK MEDICAL CARE IF: Your symptoms do not improve after 1 week of treatment.  SEEK IMMEDIATE MEDICAL CARE IF:  Your fever increases.  You have chills.   You have chest pain.   You have worsening shortness of breath.   You have bloody sputum.  You faint.  You have lightheadedness.  You have a severe headache.   You vomit repeatedly. MAKE SURE YOU:   Understand these instructions.  Will watch your condition.  Will get help right away if you are not doing well or get worse. Document Released: 11/16/2005 Document Revised: 09/06/2013 Document Reviewed: 07/11/2013 [facility] Patient Information 2014 [place].   Hypertension As your heart beats,  it forces blood through your arteries. This force is your blood pressure. If the pressure is too high, it is called hypertension (HTN) or high blood pressure. HTN is dangerous because you may have it and not know it. High blood pressure may mean that your heart has to  work harder to pump blood. Your arteries may be narrow or stiff. The extra work puts you at risk for heart disease, stroke, and other problems.  Blood pressure consists of two numbers, a higher number over a lower, 110/72, for example. It is stated as "110 over 72." The ideal is below 120 for the top number (systolic) and under 80 for the bottom (diastolic). Write down your blood pressure today. You should pay close attention to your blood pressure if you have certain conditions such as:  Heart failure.  Prior heart attack.  Diabetes  Chronic kidney disease.  Prior stroke.  Multiple risk factors for heart disease. To see if you have HTN, your blood pressure should be measured while you are seated with your arm held at the level of the heart. It should be measured at least twice. A one-time elevated blood pressure reading (especially in the Emergency Department) does not mean that you need treatment. There may be conditions in which the blood pressure is different between your right and left arms. It is important to see your caregiver soon for a recheck. Most people have essential hypertension which means that there is not a specific cause. This type of high blood pressure may be lowered by changing lifestyle factors such as:  Stress.  Smoking.  Lack of exercise.  Excessive weight.  Drug/tobacco/alcohol use.  Eating less salt. Most people do not have symptoms from high blood pressure until it has caused damage to the body. Effective treatment can often prevent, delay or reduce that damage. TREATMENT  When a cause has been identified, treatment for high blood pressure is directed at the cause. There are a large number of medications to treat HTN. These fall into several categories, and your caregiver will help you select the medicines that are best for you. Medications may have side effects. You should review side effects with your caregiver. If your blood pressure stays high after you  have made lifestyle changes or started on medicines,   Your medication(s) may need to be changed.  Other problems may need to be addressed.  Be certain you understand your prescriptions, and know how and when to take your medicine.  Be sure to follow up with your caregiver within the time frame advised (usually within two weeks) to have your blood pressure rechecked and to review your medications.  If you are taking more than one medicine to lower your blood pressure, make sure you know how and at what times they should be taken. Taking two medicines at the same time can result in blood pressure that is too low. SEEK IMMEDIATE MEDICAL CARE IF:  You develop a severe headache, blurred or changing vision, or confusion.  You have unusual weakness or numbness, or a faint feeling.  You have severe chest or abdominal pain, vomiting, or breathing problems. MAKE SURE YOU:   Understand these instructions.  Will watch your condition.  Will get help right away if you are not doing well or get worse. Document Released: 11/16/2005 Document Revised: 02/08/2012 Document Reviewed: 07/06/2008 [facility] Patient Information 2014 [place].   Emergency Department Resource Guide 1) Find a Doctor and Pay Out of Pocket Although you won't have to  find out who is covered by your insurance plan, it is a good idea to ask around and get recommendations. You will then need to call the office and see if the doctor you have chosen will accept you as a new patient and what types of options they offer for patients who are self-pay. Some doctors offer discounts or will set up payment plans for their patients who do not have insurance, but you will need to ask so you aren't surprised when you get to your appointment.  2) Contact Your Local Health Department Not all health departments have doctors that can see patients for sick visits, but many do, so it is worth a call to see if yours does. If you don't know  where your local health department is, you can check in your phone book. The CDC also has a tool to help you locate your state's health department, and many state websites also have listings of all of their local health departments.  3) Find a [facility] If your illness is not likely to be very severe or complicated, you may want to try a walk in clinic. These are popping up all over the country in pharmacies, drugstores, and shopping centers. They're usually staffed by nurse practitioners or physician assistants that have been trained to treat common illnesses and complaints. They're usually fairly quick and inexpensive. However, if you have serious medical issues or chronic medical problems, these are probably not your best option.  No Primary Care Doctor: - Call Health Connect at  [phone] - they can help you locate a primary care doctor that  accepts your insurance, provides certain services, etc. - Physician Referral Service- [phone]  Chronic Pain Problems: Organization         Address  Phone   Notes  [facility]  [phone] Patients need to be referred by their primary care doctor.   Medication Assistance: Organization         Address  Phone   Notes  [facility] Medication [facility] [address] [place], [place] [address] [phone] --Must be a resident of [facility] -- Must have NO insurance coverage whatsoever (no Medicaid/ Medicare, etc.) -- The pt. MUST have a primary care doctor that directs their care regularly and follows them in the community   MedAssist  [phone]   [employer]  [phone]    Agencies that provide inexpensive medical care: Organization         Address  Phone   Notes  [place]  [phone]   [facility]    [phone]   [facility] [place], [place] [address] [phone]   [place] [address], [place] [phone]   Planned Parenthood    [phone]   [facility]    [phone]   [place] and [place], [place] Phone:  [phone], Fax:  [phone] Hours of Operation:  9 am - 6 pm, M-F.  Also accepts Medicaid/Medicare and self-pay.  [facility] for [place] [address], [address], [place] Phone: [phone], Fax: [phone]. Hours of Operation:  8:30 am - 5:30 pm, M-F.  Also accepts Medicaid and self-pay.  [facility] [address], [employer] Phone: [phone]   [place], [place]  [place], [place] [phone], Ext. [phone] Mondays & Thursdays: 7-9 AM.  First 15 patients are seen on a first come, first serve basis.    [place] Providers:  Organization         Address  Phone   Notes  [facility] [address], [place] [phone] Also accepts self-pay patients.  [facility] [id number] [place], [place]  [phone]   [place], [address], [place] [phone]   [facility] [address] [place] [phone]   Lucianne Lei [address], [place]   [phone] Only accepts [place] Access [place] patients after they have their name applied to their card.   Self-Pay (no insurance) in [facility]:  Organization         Address  Phone   Notes  Sickle Cell Patients, [facility] [place] [phone]   [facility] Urgent Care [place] [phone]   [facility] [place]  [place], [place], [place] [phone]   [facility]/Dr. Osei-Bonsu  [address] [place] or [address], [place] [phone] Phone number for both  [place] and [place] locations is the same.  [facility]gent [facility] and [facility] [address] [place] [phone]   [facility] [address] [place] or [address] [phone]   [facility] [address], [place] [phone], phone; [phone], fax Sees patients 1st and 3rd Saturday of every month.  Must not qualify for public or private insurance (i.e. Medicaid, Medicare, [place] Health Choice, Veterans' Benefits)  Household income should be no more than 200% of the poverty level The clinic cannot treat you if you are pregnant or think you are pregnant  Sexually transmitted diseases are not treated at the clinic.    Dental Care: Organization         Address  Phone  Notes  [facility] of [facility] [place] [phone] Accepts children up to age 75 who are enrolled in [place] or [place]; pregnant women with a Medicaid card; and children who have applied for Medicaid or [place] Health Choice, but were declined, whose parents can pay a reduced fee at time of service.  [facility] Department of [facility]  [address], [place] [phone] Accepts children up to age 44 who are enrolled in [place] or [place]; pregnant women with a Medicaid card; and children who have applied for Medicaid or [place] Health Choice, but were declined, whose parents can pay a reduced fee at time of service.  [place] Adult Dental Access PROGRAM  [place] [phone] Patients are seen by appointment only. Walk-ins are not accepted. [place] will see patients 57 years of age and older. Monday - Tuesday (8am-5pm) Most Wednesdays (8:30-5pm) $30 per visit, cash only  [facility] Adult Dental Access PROGRAM  [address], [facility] [phone] Patients are seen by appointment only. Walk-ins are not  accepted. [place] will see patients 52 years of age and older. One Wednesday Evening (Monthly: Volunteer Based).  $30 per visit, cash only  [employer]  (  [phone] for adults; Children under age 74, call Graduate Pediatric Dentistry at [phone]. Children aged 77-14, please call [phone] to request a pediatric application.  Dental services are provided in all areas of dental care including fillings, crowns and bridges, complete and partial dentures, implants, gum treatment, root canals, and extractions. Preventive care is also provided. Treatment is provided to both adults and children. Patients are selected via a lottery and there is often a waiting list.   [facility] [address], [place]  [phone] [email or web address]   [facility] [address] [place], [place] [phone], Ext. [phone] Second and Fourth Thursday of each month, opens at 6:30 AM; Clinic ends at 9 AM.  Patients are seen on a first-come first-served basis, and a limited number are seen during each clinic.   [facility]  [address] Hillard Danker [place], [place] [phone]   Eligibility Requirements You must have lived in [place], [place], or [place] counties for at least the last three months.   You cannot be eligible for state or federal sponsored [employer], including [employer], [place], or [employer].   You generally cannot be eligible for healthcare insurance through your employer.    How to apply: Eligibility screenings are held every Tuesday and Wednesday afternoon from 1:00 pm until 4:00 pm. You do not need an appointment for the interview!  [facility] [address], [place], [place]   [place]  [place] Department  [place]  [phone]    Behavioral Health Resources in the  Community: Intensive Outpatient Programs Organization         Address  Phone  Notes  [place] [facility] [place]. [address], [place], [place] [phone]   [facility] Outpatient [address] [place], [place]   ADS: Alcohol & Drug Svcs [address], [place], [place]   [place] [address],  [place], [place] or [phone]   Substance Abuse Resources Organization         Address  Phone  Notes  [facility] Drug Services  [phone]   [place]  [phone]   [facility] [place]   Chinita Pester  [phone]   Residential & Outpatient Substance Abuse Program  [phone]   Psychological Services Organization         Address  Phone  Notes  [facility] [place]  [place]  [phone]   [place] [address] [place] or [phone]    Mobile Crisis Teams Organization         Address  Phone  Notes  [facility], Mobile Crisis Care Unit  [phone]   [facility]  [address] [place], [place]   [place] [address], [place] [phone]    Self-Help/Support Groups Organization         Address  Phone             Notes  [place]. of [place] - variety of support groups  [place] Call for more information  Narcotics Anonymous (NA), [facility] [address], [employer] [place]  2 meetings at this location   Brewing technologist  Notes  [facility] [address]    [place]  [phone]   [facility]  [address], [place] [address], [place], [place]   Council [place], [employer] [phone] Admissions: 8am-3pm M-F  Incentives Substance [place] [address],    [place], [place] [phone]   [facility] [address] [place], [place], [place]   [facility] [address],  [place], [place]   [facility] - Intensive Outpatient [address], [place], [place]   [facility] ([place].,  [place], [place] [phone] or [phone]   [facility] (RTS) [address] [place], [place] Accepts Medicaid  [facility] [address],  [place] [phone] Substance Abuse/Addiction Treatment   [facility] Organization         Address  Phone  Notes  [facility]  [phone]   Domenic Schwab, PhD [address] Arlis Porta [place], [place]   [phone] or [phone]   [place] Opal [place], [place] [phone]   [facility] [address] [place], [place] [phone] Insurance/Medicaid/sponsorship through [facility] Families [address] [place]                                    [place], [place] [phone] [place] [address], [place] [phone]    Dr. Adele Schilder  [phone]   [facility] Dept. 1) [address], [place] 2) [place] 3)  [place] [address], [place] [phone]  [phone]   [place] [phone] or [phone] (After Hours)

## 2014-01-04 NOTE — ED Provider Notes (Signed)
CSN: [id number]     Arrival date & time 01/04/14  1701 History   First MD Initiated Contact with Patient 01/04/14 1754     Chief Complaint  Patient presents with  . Cough    HPI  Carol Hardy is a 53 y.o. female with a PMH of DM and panic attack who presents to the ED for evaluation of cough.  History was provided by the patient.  Patient states that she has had a cough for the past 1.5 weeks.  Her cough is non-productive.  Patient states she has tried Surveyor, minerals and Mucinex with no relief.  She denies any chest pain or SOB.  She has had nasal congestion with no rhinorrhea.  She also has a hoarse voice from coughing but denies any sore throat.  She is a smoker.  She denies any hx of COPD or asthma.  Patient is a tobacco user.  No fevers, chills, change in appetite/activity, headache, neck pain, abdominal pain, emesis, nausea, diarrhea, constipation, or leg edema.  No hx of DVT/PE/clotting.  No recent travel/surgery. No hx of cardiac disease.    Past Medical History  Diagnosis Date  . Diabetes mellitus   . Panic attack    Past Surgical History  Procedure Laterality Date  . Tubal ligation    . Foot surgery     Family History  Problem Relation Age of Onset  . Diabetes Father    History  Substance Use Topics  . Smoking status: Current Every Day Smoker -- 0.50 packs/day    Types: Cigarettes  . Smokeless tobacco: Not on file  . Alcohol Use: No   OB History   Grav Para Term Preterm Abortions TAB SAB Ect Mult Living   2 0 0  2   2       Review of Systems  Constitutional: Negative for fever, chills, diaphoresis, activity change, appetite change and fatigue.  HENT: Positive for congestion and voice change. Negative for ear pain, rhinorrhea, sore throat and trouble swallowing.   Respiratory: Positive for cough. Negative for chest tightness, shortness of breath and wheezing.   Cardiovascular: Negative for chest pain and leg swelling.  Gastrointestinal: Negative for nausea, vomiting and  abdominal pain.  Genitourinary: Negative for dysuria and difficulty urinating.  Musculoskeletal: Negative for back pain, gait problem and myalgias.  Neurological: Negative for speech difficulty, weakness and light-headedness.    Allergies  Ibuprofen; Sulfa antibiotics; and Vicodin  Home Medications   Current Outpatient Rx  Name  Route  Sig  Dispense  Refill  . guaiFENesin (MUCINEX) 600 MG 12 hr tablet   Oral   Take 1,200 mg by mouth 2 (two) times daily as needed for cough.          BP 190/83  Pulse 101  Temp(Src) 97.6 F (36.4 C) (Oral)  Resp 18  SpO2 100%  LMP 12/31/2013  Filed Vitals:   01/04/14 1709 01/04/14 1917 01/04/14 2035  BP: 190/83 163/81 179/81  Pulse: 101 94 90  Temp: 97.6 F (36.4 C) 97.6 F (36.4 C) 98.5 F (36.9 C)  TempSrc: Oral Oral Oral  Resp: 18 16 16   SpO2: 100% 100% 98%    Physical Exam  Nursing note and vitals reviewed. Constitutional: She is oriented to person, place, and time. She appears well-developed and well-nourished. No distress.  HENT:  Head: Normocephalic and atraumatic.  Right Ear: External ear normal.  Left Ear: External ear normal.  Nose: Nose normal.  Mouth/Throat: Oropharynx is clear and moist. No  oropharyngeal exudate.  Hoarse voice. No erythema to the posterior pharynx. Tonsils without edema or exudates. Uvula midline. No trismus. No difficulty controlling secretions. Tympanic membranes gray and translucent bilaterally with no erythema, edema, or hemotympanum.    Eyes: Conjunctivae are normal. Pupils are equal, round, and reactive to light. Right eye exhibits no discharge. Left eye exhibits no discharge.  Neck: Normal range of motion. Neck supple.  No LAD.   Cardiovascular: Normal rate, regular rhythm, normal heart sounds and intact distal pulses.  Exam reveals no gallop and no friction rub.   No murmur heard. Pulmonary/Chest: Effort normal and breath sounds normal. No respiratory distress. She has no wheezes. She has no  rales. She exhibits no tenderness.  Dry cough throughout exam  Abdominal: Soft. She exhibits no distension and no mass. There is no tenderness. There is no rebound and no guarding.  Musculoskeletal: Normal range of motion. She exhibits no edema and no tenderness.  No LE edema or calf tenderness bilaterally. Patient able to ambulate without difficulty or ataxia  Neurological: She is alert and oriented to person, place, and time.  Skin: Skin is warm and dry. She is not diaphoretic.    ED Course  Procedures (including critical care time) Labs Review Labs Reviewed - No data to display Imaging Review No results found.  EKG Interpretation   None      DG Chest 2 View (Final result)  Result time: 01/04/14 18:23:50    Final result by Rad Results In Interface (01/04/14 18:23:50)    Narrative:   CLINICAL DATA: Cough. Shortness of breath.  EXAM: CHEST 2 VIEW  COMPARISON: 09/10/2013  FINDINGS: The lungs appear clear. Cardiac and mediastinal contours normal.  No pleural effusion identified.  Stable mild dextroconvex thoracic scoliosis. Stable posttraumatic deformity at the right [facility] joint.  IMPRESSION: 1. No acute findings. 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   Electronically Signed By: Sherryl Barters M.D. On: 01/04/2014 18:23    Results for orders placed during the hospital encounter of 01/04/14  CBC WITH DIFFERENTIAL      Result Value Range   WBC 10.2  4.0 - 10.5 K/uL   RBC 5.62 (*) 3.87 - 5.11 MIL/uL   Hemoglobin 12.7  12.0 - 15.0 g/dL   HCT 39.7  36.0 - 46.0 %   MCV 70.6 (*) 78.0 - 100.0 fL   MCH 22.6 (*) 26.0 - 34.0 pg   MCHC 32.0  30.0 - 36.0 g/dL   RDW 14.9  11.5 - 15.5 %   Platelets 270  150 - 400 K/uL   Neutrophils Relative % 50  43 - 77 %   Lymphocytes Relative 38  12 - 46 %   Monocytes Relative 7  3 - 12 %   Eosinophils Relative 5  0 - 5 %   Basophils Relative 0  0 - 1 %   Neutro Abs 5.1  1.7 - 7.7 K/uL   Lymphs Abs 3.9  0.7 - 4.0 K/uL    Monocytes Absolute 0.7  0.1 - 1.0 K/uL   Eosinophils Absolute 0.5  0.0 - 0.7 K/uL   Basophils Absolute 0.0  0.0 - 0.1 K/uL   RBC Morphology TARGET CELLS    BASIC METABOLIC PANEL      Result Value Range   Sodium 136 (*) 137 - 147 mEq/L   Potassium 5.1  3.7 - 5.3 mEq/L   Chloride 103  96 - 112 mEq/L   CO2 19  19 - 32 mEq/L  Glucose, Bld 110 (*) 70 - 99 mg/dL   BUN 16  6 - 23 mg/dL   Creatinine, Ser 1.53 (*) 0.50 - 1.10 mg/dL   Calcium 9.1  8.4 - 10.5 mg/dL   GFR calc non Af Amer 39 (*) >90 mL/min   GFR calc Af Amer 45 (*) >90 mL/min  PRO B NATRIURETIC PEPTIDE      Result Value Range   Pro B Natriuretic peptide (BNP) 508.3 (*) 0 - 125 pg/mL    MDM   Carol Hardy is a 53 y.o. female  with a PMH of DM and panic attack who presents to the ED for evaluation of cough.  Rechecks  8:30 PM = Patient reports improvement in symptoms with albuterol inhaler and tessalon.    Patient evaluated for a cough x 1.5 weeks. Patient likely has bronchitis. Chest x-ray negative for an acute cardiopulmonary process. Patient afebrile and non-toxic in appearance. Patient given albuterol inhaler in the ED with improvements in her symptoms. Patient encouraged to continue to use inhaler as an OP. Labs revealed mildly elevated creatinine improved from baseline. Patient's BNP mildly elevated however has no evidence of congestion on x-ray or leg edema. Wells score 0. Patient prescribed azithromycin due to hx of diabetes and smoking use. Encouraged to follow-up with PCP. Patient's BP elevated during ED visit. Patient states she has had elevated BP in the past but is not on any anti-hypertensive medications. Instructed patient on importance of follow-up and re-check. Return precautions, discharge instructions, and follow-up was discussed with the patient before discharge.     Discharge Medication List as of 01/04/2014  9:07 PM    START taking these medications   Details  azithromycin (ZITHROMAX) 250 MG tablet  Take 1 tablet (250 mg total) by mouth daily. Take first 2 tablets together, then 1 every day until finished., Starting 01/04/2014, Until Discontinued, Print    guaiFENesin-dextromethorphan (ROBITUSSIN DM) 100-10 MG/5ML syrup Take 5 mLs by mouth every 4 (four) hours as needed for cough., Starting 01/04/2014, Until Discontinued, Print         Final impressions: 1. Bronchitis   2. Elevated blood pressure       Harold Hedge Domonik Levario PA-C           Lucila Maine, [place] 01/05/14 1344

## 2014-01-06 NOTE — ED Provider Notes (Signed)
Medical screening examination/treatment/procedure(s) were performed by non-physician practitioner and as supervising physician I was immediately available for consultation/collaboration.  Richarda Blade, MD 01/06/14 [phone]

## 2014-10-01 ENCOUNTER — Encounter (HOSPITAL_COMMUNITY): Payer: Self-pay | Admitting: Emergency Medicine

## 2014-10-28 ENCOUNTER — Inpatient Hospital Stay (HOSPITAL_COMMUNITY)
Admission: EM | Admit: 2014-10-28 | Discharge: 2014-10-28 | DRG: 282 | Discharge status: Against Medical Advice | Payer: Self-pay | Attending: Emergency Medicine | Admitting: Emergency Medicine

## 2014-10-28 ENCOUNTER — Encounter (HOSPITAL_COMMUNITY): Payer: Self-pay | Admitting: *Deleted

## 2014-10-28 ENCOUNTER — Emergency Department (HOSPITAL_COMMUNITY): Payer: Self-pay

## 2014-10-28 DIAGNOSIS — Z79899 Other long term (current) drug therapy: Secondary | ICD-10-CM

## 2014-10-28 DIAGNOSIS — F419 Anxiety disorder, unspecified: Secondary | ICD-10-CM | POA: Diagnosis present

## 2014-10-28 DIAGNOSIS — F17210 Nicotine dependence, cigarettes, uncomplicated: Secondary | ICD-10-CM | POA: Diagnosis present

## 2014-10-28 DIAGNOSIS — E119 Type 2 diabetes mellitus without complications: Secondary | ICD-10-CM | POA: Diagnosis present

## 2014-10-28 DIAGNOSIS — I214 Non-ST elevation (NSTEMI) myocardial infarction: Principal | ICD-10-CM | POA: Diagnosis present

## 2014-10-28 LAB — CBC WITH DIFFERENTIAL/PLATELET
BASOS ABS: 0 10*3/uL (ref 0.0–0.1)
Basophils Relative: 0 % (ref 0–1)
Eosinophils Absolute: 0.5 10*3/uL (ref 0.0–0.7)
Eosinophils Relative: 6 % — ABNORMAL HIGH (ref 0–5)
HCT: 44.5 % (ref 36.0–46.0)
Hemoglobin: 14 g/dL (ref 12.0–15.0)
Lymphocytes Relative: 41 % (ref 12–46)
Lymphs Abs: 3.7 10*3/uL (ref 0.7–4.0)
MCH: 22.5 pg — AB (ref 26.0–34.0)
MCHC: 31.5 g/dL (ref 30.0–36.0)
MCV: 71.5 fL — ABNORMAL LOW (ref 78.0–100.0)
MONO ABS: 0.5 10*3/uL (ref 0.1–1.0)
Monocytes Relative: 6 % (ref 3–12)
NEUTROS ABS: 4.4 10*3/uL (ref 1.7–7.7)
Neutrophils Relative %: 47 % (ref 43–77)
PLATELETS: 256 10*3/uL (ref 150–400)
RBC: 6.22 MIL/uL — ABNORMAL HIGH (ref 3.87–5.11)
RDW: 15 % (ref 11.5–15.5)
WBC: 9.1 10*3/uL (ref 4.0–10.5)

## 2014-10-28 LAB — TROPONIN I
Troponin I: <0.3 ng/mL (ref <0.30)
Troponin I: 1.07 ng/mL (ref <0.30)

## 2014-10-28 LAB — BASIC METABOLIC PANEL
Anion gap: 15 (ref 5–15)
BUN: 19 mg/dL (ref 6–23)
CALCIUM: 9.7 mg/dL (ref 8.4–10.5)
CO2: 22 mEq/L (ref 19–32)
CREATININE: 1.54 mg/dL — AB (ref 0.50–1.10)
Chloride: 102 mEq/L (ref 96–112)
GFR calc Af Amer: 44 mL/min — ABNORMAL LOW (ref >90)
GFR, EST NON AFRICAN AMERICAN: 38 mL/min — AB (ref >90)
GLUCOSE: 239 mg/dL — AB (ref 70–99)
Potassium: 4.9 mEq/L (ref 3.7–5.3)
Sodium: 139 mEq/L (ref 137–147)

## 2014-10-28 MED ORDER — HEPARIN BOLUS VIA INFUSION: 4000.0000 [IU] | Freq: Once | INTRAVENOUS | Status: DC

## 2014-10-28 MED ORDER — ASPIRIN 81 MG PO CHEW
324.0000 mg | CHEWABLE_TABLET | Freq: Once | ORAL | Status: AC
  Administered 2014-10-28: 324 mg via ORAL
  Filled 2014-10-28: qty 4

## 2014-10-28 MED ORDER — HEPARIN SODIUM (PORCINE) 5000 UNIT/ML IJ SOLN
4000.0000 [IU] | Freq: Once | INTRAMUSCULAR | Status: DC
  Filled 2014-10-28: qty 0.8

## 2014-10-28 MED ORDER — ACETAMINOPHEN 500 MG PO TABS
1000.0000 mg | ORAL_TABLET | Freq: Once | ORAL | Status: DC
  Filled 2014-10-28: qty 2

## 2014-10-28 MED ORDER — GI COCKTAIL ~~LOC~~
30.0000 mL | Freq: Once | ORAL | Status: AC
  Administered 2014-10-28: 30 mL via ORAL
  Filled 2014-10-28: qty 30

## 2014-10-28 MED ORDER — NITROGLYCERIN IN D5W 200-5 MCG/ML-% IV SOLN
10.0000 ug/min | INTRAVENOUS | Status: DC
  Administered 2014-10-28: 10 ug/min via INTRAVENOUS
  Filled 2014-10-28: qty 250

## 2014-10-28 MED ORDER — METOPROLOL TARTRATE 1 MG/ML IV SOLN
5.0000 mg | Freq: Once | INTRAVENOUS | Status: AC
  Administered 2014-10-28: 5 mg via INTRAVENOUS
  Filled 2014-10-28: qty 5

## 2014-10-28 MED ORDER — HYDROMORPHONE HCL 1 MG/ML IJ SOLN: 0.5000 mg | Freq: Once | INTRAMUSCULAR | Status: DC

## 2014-10-28 MED ORDER — NITROGLYCERIN 0.4 MG SL SUBL
SUBLINGUAL_TABLET | SUBLINGUAL | Status: AC
  Administered 2014-10-28: 0.4 mg
  Filled 2014-10-28: qty 1

## 2014-10-28 MED ORDER — HEPARIN (PORCINE) IN NACL 100-0.45 UNIT/ML-% IJ SOLN
1000.0000 [IU]/h | INTRAMUSCULAR | Status: DC
  Administered 2014-10-28 (×2): 1000 [IU]/h via INTRAVENOUS
  Filled 2014-10-28: qty 250

## 2014-10-28 NOTE — ED Notes (Signed)
[name] in lab called to report critical Troponin of 1.07. PA Will and MD notified.

## 2014-10-28 NOTE — ED Provider Notes (Signed)
CSN: [id number]     Arrival date & time 10/28/14  5701 History   First MD Initiated Contact with Patient 10/28/14 [phone]     Chief Complaint  Patient presents with  . Chest Pain   Carol Hardy is a 53 y.o. female with a hx of diabetes, anxiety, and a smoker who presents the emergency department complaining of non-radiating right-sided chest pain for the past 5 days. Patient reports gradual onset of right-sided non-radiating chest pain that is worse today. She currently rates her pain a 9 out of 10 reports that her pain is worse lying flat and better sitting up. She reports her pain has been constant since 3 am this morning and is not worse with exertion. Her chest is nontender to palpation. Patient reports an intermittent dry cough for the past week. The patient reports feeling more anxious today. Patient denies fevers, chills, shortness of breath, palpitations, leg swelling, wheezing, abdominal pain, nausea, vomiting, numbness, tingling, or weakness. The patient denies personal or family history of cardiac vascular disease. The patient denies personal or family history of DVTs or PEs. Patient denies personal or family history of factor V Leiden, protein C or S deficiency. Patient denies long travel recently. Patient denies trauma to her chest.     (Consider location/radiation/quality/duration/timing/severity/associated sxs/prior Treatment) HPI  Past Medical History  Diagnosis Date  . Diabetes mellitus   . Panic attack    Past Surgical History  Procedure Laterality Date  . Tubal ligation    . Foot surgery     Family History  Problem Relation Age of Onset  . Diabetes Father    History  Substance Use Topics  . Smoking status: Current Every Day Smoker -- 0.50 packs/day    Types: Cigarettes  . Smokeless tobacco: Not on file  . Alcohol Use: No   OB History    Gravida Para Term Preterm AB TAB SAB Ectopic Multiple Living   2 0 0  2   2       Review of Systems  Constitutional:  Negative for fever and chills.  HENT: Negative for congestion, ear pain, facial swelling, sneezing, sore throat and trouble swallowing.   Eyes: Negative for pain, redness and visual disturbance.  Respiratory: Positive for cough. Negative for shortness of breath and wheezing.   Cardiovascular: Positive for chest pain. Negative for palpitations and leg swelling.  Gastrointestinal: Negative for nausea, vomiting, abdominal pain and diarrhea.  Genitourinary: Negative for dysuria, hematuria, decreased urine volume and difficulty urinating.  Musculoskeletal: Negative for back pain and neck pain.  Skin: Negative for rash and wound.  Neurological: Negative for dizziness, weakness, light-headedness, numbness and headaches.  Psychiatric/Behavioral: The patient is nervous/anxious.       Allergies  Ibuprofen; Sulfa antibiotics; and Vicodin  Home Medications   Prior to Admission medications   Medication Sig Start Date End Date Taking? Authorizing Provider  azithromycin (ZITHROMAX) 250 MG tablet Take 1 tablet (250 mg total) by mouth daily. Take first 2 tablets together, then 1 every day until finished. Patient not taking: Reported on 10/28/2014 01/04/14   Lucila Maine, PA-C  guaiFENesin-dextromethorphan (ROBITUSSIN DM) 100-10 MG/5ML syrup Take 5 mLs by mouth every 4 (four) hours as needed for cough. Patient not taking: Reported on 10/28/2014 01/04/14   Lucila Maine, PA-C   BP 156/68 mmHg  Pulse 85  Temp(Src) 98.8 F (37.1 C) (Oral)  Resp 18  Ht 5\' 2"  (1.575 m)  Wt 162 lb (73.483 kg)  BMI 29.62 kg/m2  SpO2 100%  LMP  Physical Exam  Constitutional: She appears well-developed and well-nourished. No distress.  HENT:  Head: Normocephalic and atraumatic.  Mouth/Throat: Oropharynx is clear and moist. No oropharyngeal exudate.  Eyes: Conjunctivae are normal. Pupils are equal, round, and reactive to light. Right eye exhibits no discharge. Left eye exhibits no discharge.  Neck: Neck supple.   Cardiovascular: Normal rate, regular rhythm, normal heart sounds and intact distal pulses.  Exam reveals no gallop and no friction rub.   No murmur heard. Bilateral radial pulses are intact. Bilateral posterior tibialis pulses are intact  Pulmonary/Chest: Effort normal and breath sounds normal. No respiratory distress. She has no wheezes. She has no rales. She exhibits no tenderness.  Abdominal: Soft. She exhibits no distension. There is no tenderness.  Musculoskeletal: She exhibits no edema or tenderness.  No lower extremity edema noted. Calves are nontender palpation.  Lymphadenopathy:    She has no cervical adenopathy.  Neurological: She is alert. Coordination normal.  Skin: Skin is warm and dry. No rash noted. She is not diaphoretic. No erythema. No pallor.  Psychiatric: Her behavior is normal. Her mood appears anxious.  Patient appears slightly anxious.   Nursing note and vitals reviewed.   ED Course  Procedures (including critical care time) Labs Review Labs Reviewed  BASIC METABOLIC PANEL - Abnormal; Notable for the following:    Glucose, Bld 239 (*)    Creatinine, Ser 1.54 (*)    GFR calc non Af Amer 38 (*)    GFR calc Af Amer 44 (*)    All other components within normal limits  CBC WITH DIFFERENTIAL - Abnormal; Notable for the following:    RBC 6.22 (*)    MCV 71.5 (*)    MCH 22.5 (*)    Eosinophils Relative 6 (*)    All other components within normal limits  TROPONIN I - Abnormal; Notable for the following:    Troponin I 1.07 (*)    All other components within normal limits  TROPONIN I    Imaging Review Dg Chest 2 View  10/28/2014   CLINICAL DATA:  Right-sided chest pain. Pain goes to the back. Some shortness of breath.  EXAM: CHEST  2 VIEW  COMPARISON:  01/04/2014  FINDINGS: Lungs are clear bilaterally. Heart and mediastinum are within normal limits. Irregularity of the lateral right clavicle and suspect postsurgical or traumatic changes. Degenerative endplate  changes in the thoracic spine. Negative for pleural effusions.  IMPRESSION: No active cardiopulmonary disease.   Electronically Signed   By: Markus Daft M.D.   On: 10/28/2014 07:48     EKG Interpretation   Date/Time:  Sunday October 28 2014 06:35:27 EST Ventricular Rate:  76 PR Interval:  132 QRS Duration: 76 QT Interval:  345 QTC Calculation: 388 R Axis:   64 Text Interpretation:  Sinus rhythm non-spec ST-T wave changes in V5-V6 no  other significant change Reconfirmed by HARRISON  MD, FORREST ([id number]) on  10/28/2014 7:09:52 AM     Filed Vitals:   10/28/14 0820 10/28/14 0930 10/28/14 1000 10/28/14 1034  BP: 146/74 135/79 118/68 156/68  Pulse: 81 88 84 85  Temp:      TempSrc:    Oral  Resp: 23 22 18 18   Height:      Weight:      SpO2: 99% 98% 99% 100%    MDM   Meds given in ED:  Medications  HYDROmorphone (DILAUDID) injection 0.5 mg (0 mg Intravenous Hold 10/28/14 0744)  acetaminophen (  TYLENOL) tablet 1,000 mg (1,000 mg Oral Not Given 10/28/14 0749)  heparin ADULT infusion 100 units/mL (25000 units/250 mL) (1,000 Units/hr Intravenous New Bag/Given 10/28/14 0956)  nitroGLYCERIN 50 mg in dextrose 5 % 250 mL (0.2 mg/mL) infusion (10 mcg/min Intravenous New Bag/Given 10/28/14 1012)  heparin injection 4,000 Units (4,000 Units Intravenous Not Given 10/28/14 0957)  aspirin chewable tablet 324 mg (324 mg Oral Given 10/28/14 0748)  gi cocktail (Maalox,Lidocaine,Donnatal) (30 mLs Oral Given 10/28/14 0901)  nitroGLYCERIN (NITROSTAT) 0.4 MG SL tablet (0.4 mg  Given 10/28/14 0932)  metoprolol (LOPRESSOR) injection 5 mg (5 mg Intravenous Given 10/28/14 0955)    New Prescriptions   No medications on file    Final diagnoses:  NSTEMI (non-ST elevated myocardial infarction)   Carol Hardy is a 53 y.o. female with a hx of diabetes, anxiety, and a smoker who presents the emergency department complaining of non-radiating right-sided chest pain for the past 5 days. Patient reports  gradual onset of right-sided non-radiating chest pain that is worse today.  The patient later reports that her pain was intermittent and her pain now started at 3 am this morning.  Pain is not worse with exertion. Her pain is worse lying down. Denies current SOB.  The patient's delta troponin was elevated at 1.07. Patient has some increase ST depression on second EKG. Consult called to cardiology by Dr. Tamera Punt. Patient will be transferred to [facility] via care link for cardiology consult.   10:45 am: The patient is initially refusing to go to [facility] to see cardiology. After spending extensive time with this patient explaining a cardiac cath procedure she agreed to go to [facility], however she refuses to go by EMS. She reports she will only go to the hospital if she drives herself. She understands this is [place] but this is the only way I can get her to go to the hospital. Patient understands that she is to have a cardiac catheterization once arriving at the hospital. And she is at this time in agreement with this procedure. The patient is instructed to go to room 2H18. The patient again understands that leaving on her own is [place] but this is the only way she will go to the hospital. The patient left AMA and states she'll be driving herself to [facility].   This patient was discussed with and evaluated by Malvin Johns who agrees with assessment.      Hanley Hays, PA-C 10/28/14 1234  Malvin Johns, MD 10/28/14 [phone]

## 2014-10-28 NOTE — ED Notes (Signed)
Writer was requested to speak with pt regarding transfer to [facility]. Alex Education officer, museum and myself went in to speak with pt regarding transfer. Pt adamant in driving self to hospital, stating the last time she went to [facility] "I had to walk home all the way to [address]". "I am driving my Lowanda Foster offered her a signed cab slip that she can date upon discharge to return here and get her Lucianne Lei. She then changes her story and states she does not like or want to go to [facility]. Dr Tamera Punt in room and offered to transfer to another hospital. Pt remains adamant she is going to leave driving her own Lucianne Lei. Pt then states she is going to leave and go to church. Pt is aware of the danger of not proceeding with medical care and is willing to accept the responsibility. At this time her VS were assessed, IVs and monitor removed, Shelia RN will have pt sign appropriate AMA form.

## 2014-10-28 NOTE — ED Notes (Signed)
Patient is refusing to sign consent for transport to [facility].

## 2014-10-28 NOTE — ED Notes (Signed)
Pt arrived via pov from home  Pt states chest pain since Wednesday,  Worse when she lays down,  States some sob,  Denies nausea. vomiting and diarrhea

## 2014-10-28 NOTE — ED Provider Notes (Addendum)
Care take over from Dr. Aline Brochure with Will, PA-C.  On patient's second troponin, it was elevated. On review of EKG, she does have noted ST depression laterally which is different from her prior EKG. I contacted Dr. Sung Amabile the cardiologist on-call who is accepted patient for transfer to [facility]. She was started on nitroglycerin, heparin and Lopressor. She had already been given aspirin. She was pain-free after this.    While awaiting transfer, patient refused to be transferred.  She was was adamant about making sure that she had a ride back to [place] to get her car. After much work, the Education officer, museum was able to arrange a cab voucher for her to take with her to make sure that she has a ride back. However she then stated that she didn't want to go at all the [facility] and wanted to leave to go home. I asked her why she wanted to go home and she said that she just wanted to go. She said that she might [facility]. I offered to transfer to another hospital but she still refused. I stressed to her that she was having an heart attack and this was a very serious condition that can result in death. She acknowledges this but says that she just wants to go to church and does not want to go the hospital.  Ultimately Will, the PA-C was able to talk the patient into going to [facility] however she is insisting that she drive herself to [facility]. She does know that this is AGAINST MEDICAL ADVICE but this is the only way that she will go to [facility]. She initially was going to go with her IVs in place with medications discontinue however they were pulled out by the nursing staff prior to her leaving.  CRITICAL CARE Performed by: Cybill Uriegas Total critical care time: 45 Critical care time was exclusive of separately billable procedures and treating other patients. Critical care was necessary to treat or prevent imminent or life-threatening deterioration. Critical care was time spent personally by me on the following  activities: development of treatment plan with patient and/or surrogate as well as nursing, discussions with consultants, evaluation of patient's response to treatment, examination of patient, obtaining history from patient or surrogate, ordering and performing treatments and interventions, ordering and review of laboratory studies, ordering and review of radiographic studies, pulse oximetry and re-evaluation of patient's condition.   Malvin Johns, MD 10/28/14 [place], MD 10/28/14 1046

## 2014-10-28 NOTE — ED Notes (Signed)
Report given to [facility]. 

## 2014-10-28 NOTE — Progress Notes (Signed)
CSW consulted by MD and Charge RN for transportation concerns.   CSW and Agricultural consultant met with pt. Pt stated she did not want to go to [facility] for further tx/evaluation because she had been transferred there in the past and then not provided transportation home and so she had to walk a long distance back home. CSW guaranteed a taxi cab home to pt. Pt declined, stating she wanted to drive herself to [facility] hospital because she was concerned about leaving her Lucianne Lei at [facility]. Pt stated she had no family/friends that could come get the [place].  CSW re-entered room 10 minutes later. Re-offered taxi voucher. Pt declined and stated "all I want is to leave." CSW offered other services--assistance contacting family, financial counseling, crisis services-- and also reminded her of HIPPA and special confidentiality/protections around medical matters. Pt declined and remained adamant she wanted to leave.  Rochele Pages,     ED CSW  phone: [phone]

## 2014-12-04 ENCOUNTER — Emergency Department (HOSPITAL_COMMUNITY): Payer: Self-pay

## 2014-12-04 ENCOUNTER — Encounter (HOSPITAL_COMMUNITY): Payer: Self-pay | Admitting: Emergency Medicine

## 2014-12-04 ENCOUNTER — Emergency Department (HOSPITAL_COMMUNITY)
Admission: EM | Admit: 2014-12-04 | Discharge: 2014-12-04 | Disposition: A | Discharge status: Home / Self Care | Payer: Self-pay | Attending: Emergency Medicine | Admitting: Emergency Medicine

## 2014-12-04 DIAGNOSIS — Y9389 Activity, other specified: Secondary | ICD-10-CM | POA: Insufficient documentation

## 2014-12-04 DIAGNOSIS — Y998 Other external cause status: Secondary | ICD-10-CM | POA: Insufficient documentation

## 2014-12-04 DIAGNOSIS — Y929 Unspecified place or not applicable: Secondary | ICD-10-CM | POA: Insufficient documentation

## 2014-12-04 DIAGNOSIS — Z8659 Personal history of other mental and behavioral disorders: Secondary | ICD-10-CM | POA: Insufficient documentation

## 2014-12-04 DIAGNOSIS — E119 Type 2 diabetes mellitus without complications: Secondary | ICD-10-CM | POA: Insufficient documentation

## 2014-12-04 DIAGNOSIS — Z720 Tobacco use: Secondary | ICD-10-CM | POA: Insufficient documentation

## 2014-12-04 DIAGNOSIS — S3991XA Unspecified injury of abdomen, initial encounter: Secondary | ICD-10-CM | POA: Insufficient documentation

## 2014-12-04 DIAGNOSIS — Z792 Long term (current) use of antibiotics: Secondary | ICD-10-CM | POA: Insufficient documentation

## 2014-12-04 LAB — I-STAT CHEM 8, ED
BUN: 26 mg/dL — AB (ref 6–23)
Calcium, Ion: 1.11 mmol/L — ABNORMAL LOW (ref 1.12–1.23)
Chloride: 108 mEq/L (ref 96–112)
Creatinine, Ser: 1.6 mg/dL — ABNORMAL HIGH (ref 0.50–1.10)
Glucose, Bld: 142 mg/dL — ABNORMAL HIGH (ref 70–99)
HCT: 46 % (ref 36.0–46.0)
Hemoglobin: 15.6 g/dL — ABNORMAL HIGH (ref 12.0–15.0)
Potassium: 4.9 mmol/L (ref 3.5–5.1)
SODIUM: 140 mmol/L (ref 135–145)
TCO2: 20 mmol/L (ref 0–100)

## 2014-12-04 MED ORDER — OXYCODONE-ACETAMINOPHEN 5-325 MG PO TABS
1.0000 | ORAL_TABLET | Freq: Once | ORAL | Status: DC
  Filled 2014-12-04: qty 1

## 2014-12-04 NOTE — ED Notes (Signed)
Patient is wanting to leave because "everyone knows I have another job to get to and this is just taking too long for something small that could have been taken care of hours ago" Will inform RN k. Derwery and Dr.

## 2014-12-04 NOTE — ED Notes (Signed)
Patient ambulatory in NAD.
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
## 2016-08-27 IMAGING — CR DG CHEST 2V
2 series · 2 of 2 positions shown · non-contrast
Comparison: 01/04/2014

CLINICAL DATA: Right-sided chest pain. Pain goes to the back. Some
shortness of breath.

EXAM:
CHEST  2 VIEW

[w chest pa]
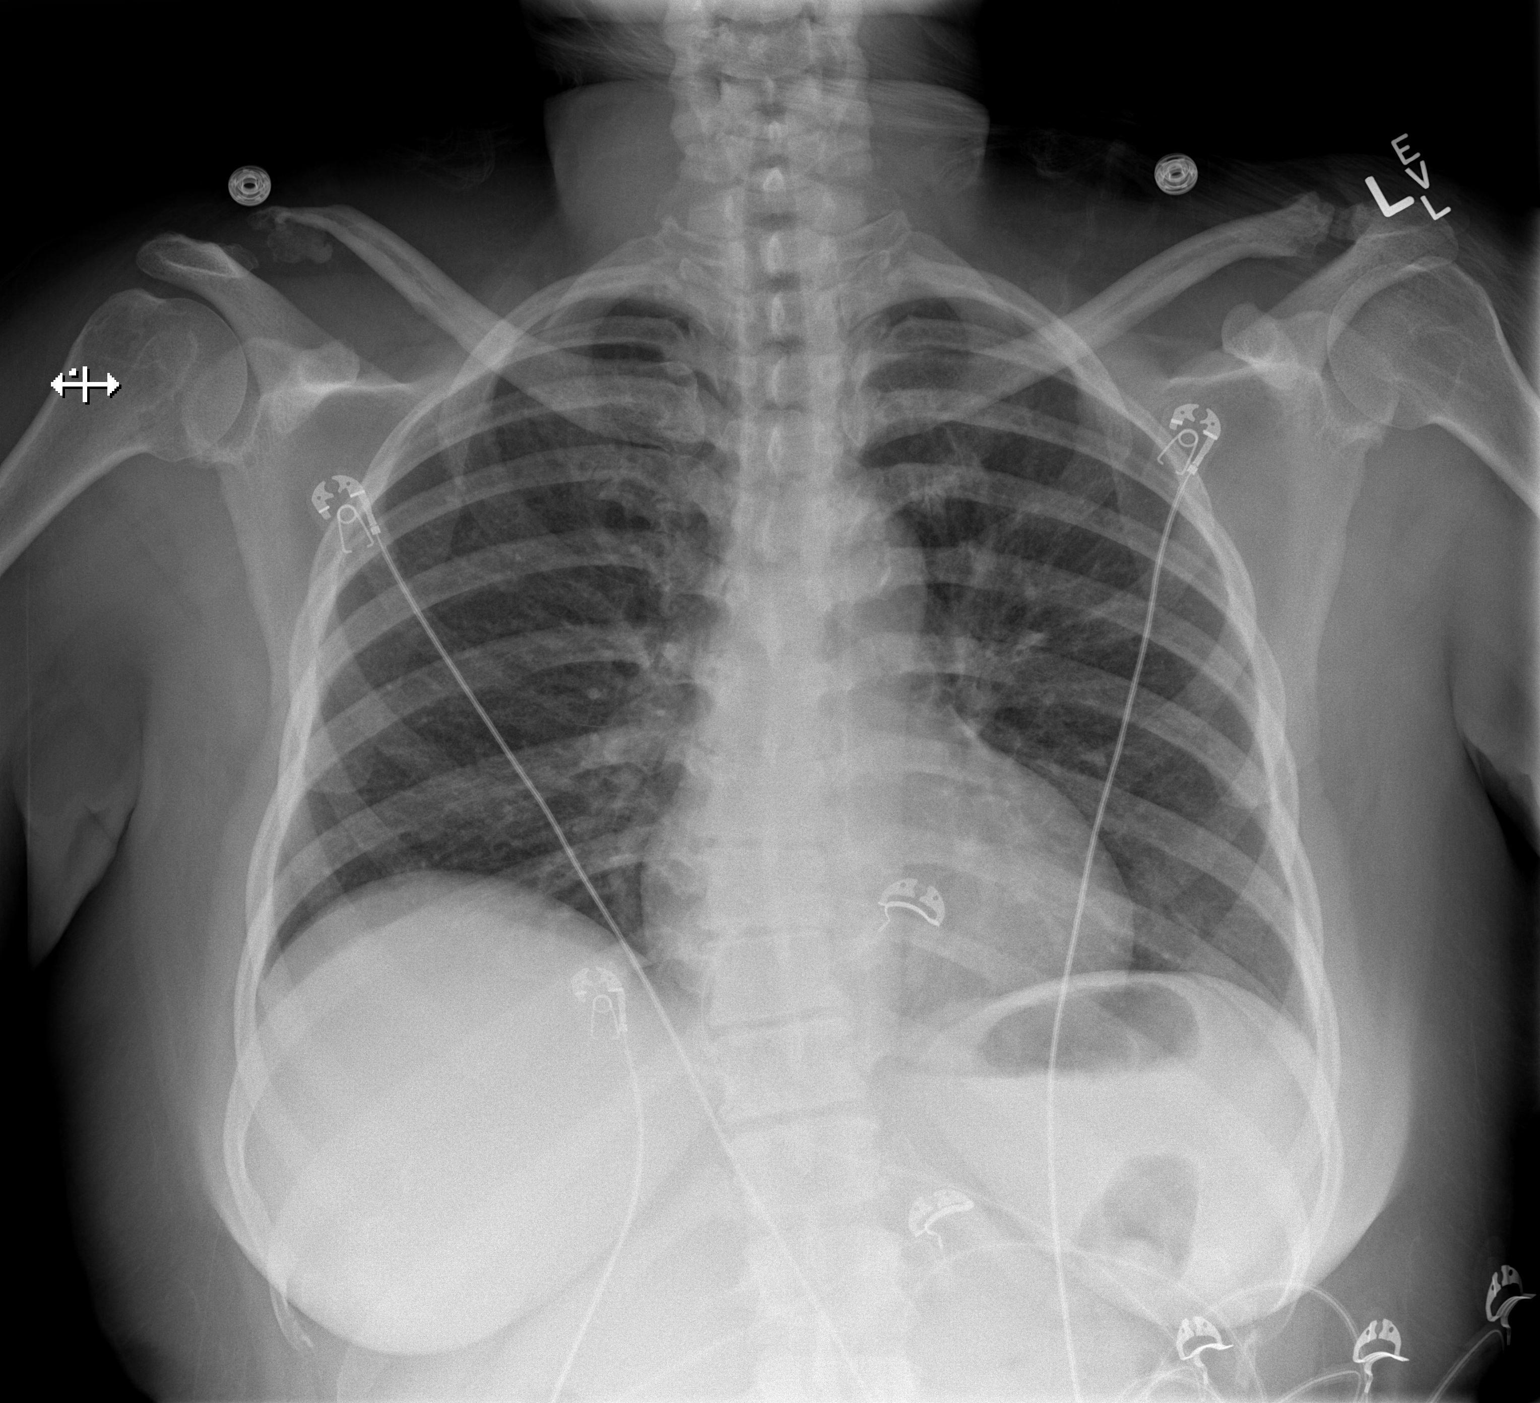

[w chest lat]
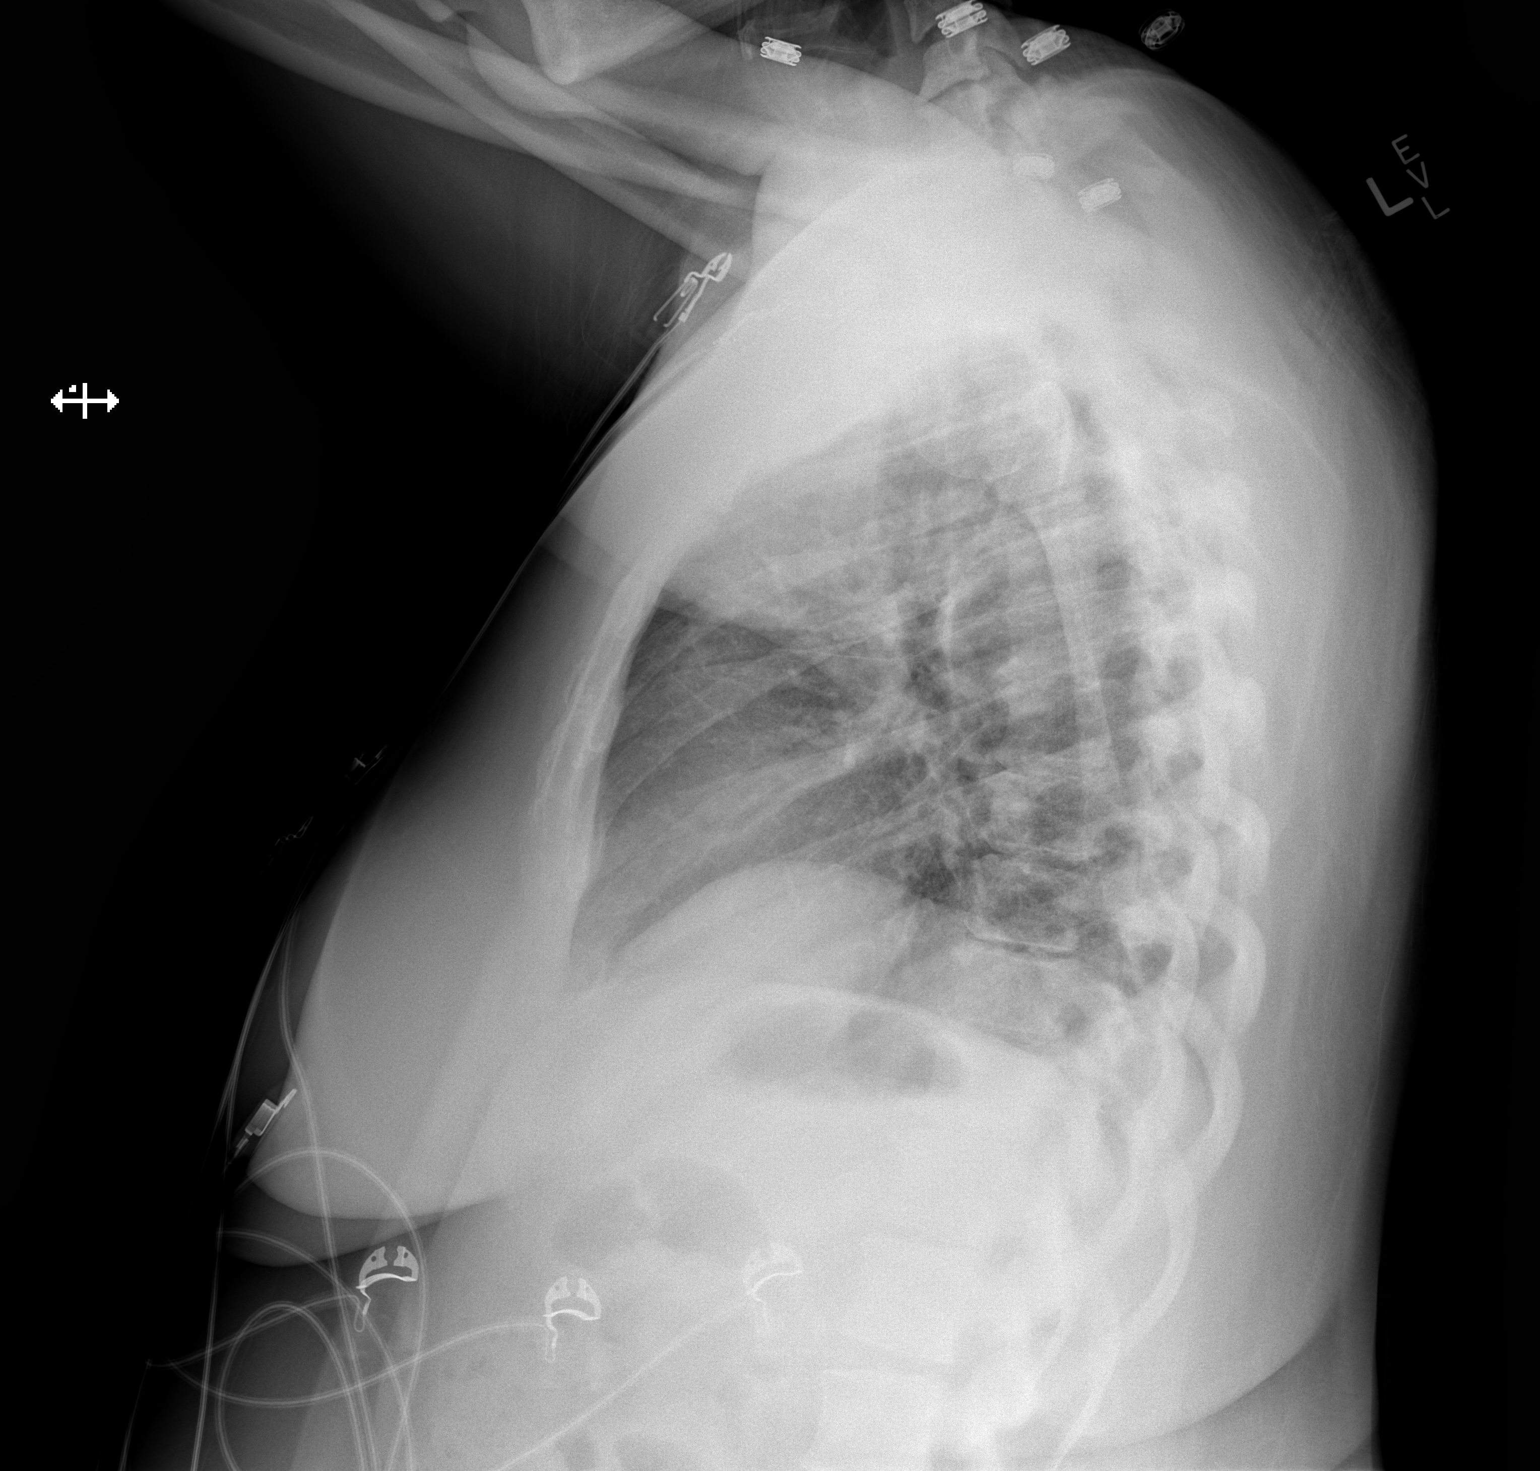

[2 of 2 positions shown; findings below may reference images not displayed]

FINDINGS: Lungs are clear bilaterally. Heart and mediastinum are within normal
limits. Irregularity of the lateral right clavicle and suspect
postsurgical or traumatic changes. Degenerative endplate changes in
the thoracic spine. Negative for pleural effusions.
IMPRESSION: No active cardiopulmonary disease.

## 2017-10-30 DEATH — deceased
